# Patient Record
Sex: Female | Born: 1970 | Race: White | Hispanic: No | Smoking: Never smoker
Health system: Southern US, Community
[De-identification: ages and names within clinical notes are randomized; demographics above are authoritative.]

## PROBLEM LIST (undated history)

## (undated) DIAGNOSIS — R112 Nausea with vomiting, unspecified: Secondary | ICD-10-CM

## (undated) DIAGNOSIS — T7840XA Allergy, unspecified, initial encounter: Secondary | ICD-10-CM

## (undated) DIAGNOSIS — Z9889 Other specified postprocedural states: Secondary | ICD-10-CM

## (undated) DIAGNOSIS — J302 Other seasonal allergic rhinitis: Secondary | ICD-10-CM

## (undated) HISTORY — DX: Nausea with vomiting, unspecified: R11.2

## (undated) HISTORY — DX: Allergy, unspecified, initial encounter: T78.40XA

## (undated) HISTORY — PX: RHINOPLASTY: SUR1284

## (undated) HISTORY — PX: APPENDECTOMY: SHX54

## (undated) HISTORY — PX: WISDOM TOOTH EXTRACTION: SHX21

## (undated) HISTORY — PX: OTHER SURGICAL HISTORY: SHX169

## (undated) HISTORY — DX: Other specified postprocedural states: Z98.890

## (undated) HISTORY — PX: EYE SURGERY: SHX253

---

## 1998-09-10 ENCOUNTER — Other Ambulatory Visit: Admission: RE | Admit: 1998-09-10 | Discharge: 1998-09-10 | Payer: Self-pay | Admitting: Obstetrics and Gynecology

## 1999-08-30 ENCOUNTER — Other Ambulatory Visit: Admission: RE | Admit: 1999-08-30 | Discharge: 1999-08-30 | Payer: Self-pay | Admitting: Obstetrics and Gynecology

## 1999-09-30 ENCOUNTER — Inpatient Hospital Stay (HOSPITAL_COMMUNITY): Admission: AD | Admit: 1999-09-30 | Discharge: 1999-09-30 | Payer: Self-pay | Admitting: Obstetrics and Gynecology

## 1999-09-30 ENCOUNTER — Encounter: Payer: Self-pay | Admitting: Obstetrics and Gynecology

## 1999-12-16 ENCOUNTER — Inpatient Hospital Stay (HOSPITAL_COMMUNITY): Admission: AD | Admit: 1999-12-16 | Discharge: 1999-12-16 | Payer: Self-pay | Admitting: Obstetrics and Gynecology

## 1999-12-16 ENCOUNTER — Encounter: Payer: Self-pay | Admitting: *Deleted

## 2000-02-04 ENCOUNTER — Other Ambulatory Visit: Admission: RE | Admit: 2000-02-04 | Discharge: 2000-02-04 | Payer: Self-pay | Admitting: Obstetrics and Gynecology

## 2001-02-05 ENCOUNTER — Other Ambulatory Visit: Admission: RE | Admit: 2001-02-05 | Discharge: 2001-02-05 | Payer: Self-pay | Admitting: Obstetrics and Gynecology

## 2003-04-22 ENCOUNTER — Emergency Department (HOSPITAL_COMMUNITY): Admission: EM | Admit: 2003-04-22 | Discharge: 2003-04-23 | Payer: Self-pay | Admitting: Emergency Medicine

## 2004-04-07 ENCOUNTER — Other Ambulatory Visit: Admission: RE | Admit: 2004-04-07 | Discharge: 2004-04-07 | Payer: Self-pay | Admitting: Obstetrics and Gynecology

## 2011-04-09 ENCOUNTER — Inpatient Hospital Stay (INDEPENDENT_AMBULATORY_CARE_PROVIDER_SITE_OTHER)
Admission: RE | Admit: 2011-04-09 | Discharge: 2011-04-09 | Disposition: A | Discharge status: Home / Self Care | Payer: 59 | Source: Ambulatory Visit | Attending: Emergency Medicine | Admitting: Emergency Medicine

## 2011-04-09 ENCOUNTER — Ambulatory Visit (INDEPENDENT_AMBULATORY_CARE_PROVIDER_SITE_OTHER): Payer: 59

## 2011-04-09 DIAGNOSIS — S9030XA Contusion of unspecified foot, initial encounter: Secondary | ICD-10-CM

## 2012-09-07 ENCOUNTER — Encounter (HOSPITAL_COMMUNITY): Payer: Self-pay | Admitting: Emergency Medicine

## 2012-09-07 ENCOUNTER — Emergency Department (HOSPITAL_COMMUNITY)
Admission: EM | Admit: 2012-09-07 | Discharge: 2012-09-08 | Disposition: A | Discharge status: Home / Self Care | Payer: BC Managed Care – PPO | Attending: Emergency Medicine | Admitting: Emergency Medicine

## 2012-09-07 DIAGNOSIS — B9689 Other specified bacterial agents as the cause of diseases classified elsewhere: Secondary | ICD-10-CM

## 2012-09-07 DIAGNOSIS — N920 Excessive and frequent menstruation with regular cycle: Secondary | ICD-10-CM

## 2012-09-07 DIAGNOSIS — N898 Other specified noninflammatory disorders of vagina: Secondary | ICD-10-CM | POA: Insufficient documentation

## 2012-09-07 HISTORY — DX: Other seasonal allergic rhinitis: J30.2

## 2012-09-07 LAB — POCT PREGNANCY, URINE: Preg Test, Ur: NEGATIVE

## 2012-09-07 LAB — CBC
HCT: 39.6 % (ref 36.0–46.0)
Hemoglobin: 13.7 g/dL (ref 12.0–15.0)
MCH: 31.4 pg (ref 26.0–34.0)
MCHC: 34.6 g/dL (ref 30.0–36.0)
MCV: 90.6 fL (ref 78.0–100.0)
Platelets: 258 10*3/uL (ref 150–400)
RBC: 4.37 MIL/uL (ref 3.87–5.11)
RDW: 12.2 % (ref 11.5–15.5)
WBC: 7.9 10*3/uL (ref 4.0–10.5)

## 2012-09-07 NOTE — ED Notes (Signed)
C/o vaginal bleeding since Wednesday.  Seen at OB-GYN on Wednesday for same and had normal [place] and non-preg test.  Bleeding was initially bright red and then small amount of dark brown bleeding.  Small amount of bleeding since Wednesday.  States she is now having bright red blood with clots "that looks like liver" x 1 hour.  Denies pain.

## 2012-09-07 NOTE — ED Notes (Signed)
Pt has IUD.  LMP 9/16

## 2012-09-08 LAB — POCT I-STAT, CHEM 8
BUN: 9 mg/dL (ref 6–23)
Calcium, Ion: 1.21 mmol/L (ref 1.12–1.23)
Creatinine, Ser: 0.9 mg/dL (ref 0.50–1.10)
Glucose, Bld: 103 mg/dL — ABNORMAL HIGH (ref 70–99)
Hemoglobin: 13.6 g/dL (ref 12.0–15.0)
TCO2: 25 mmol/L (ref 0–100)

## 2012-09-08 LAB — WET PREP, GENITAL
Trich, Wet Prep: NONE SEEN
WBC, Wet Prep HPF POC: NONE SEEN

## 2012-09-08 MED ORDER — METRONIDAZOLE 500 MG PO TABS: 500.0000 mg | ORAL_TABLET | Freq: Two times a day (BID) | ORAL | Status: DC

## 2012-09-08 NOTE — ED Provider Notes (Signed)
History     CSN: [id number]  Arrival date & time 09/07/12  1820   First MD Initiated Contact with Patient 09/07/12 2352      Chief Complaint  Patient presents with  . Vaginal Bleeding    (Consider location/radiation/quality/duration/timing/severity/associated sxs/prior treatment) Patient is a 41 y.o. female presenting with vaginal bleeding. The history is provided by the patient.  Vaginal Bleeding This is a new problem. The current episode started more than 2 days ago. The problem occurs constantly. The problem has been gradually worsening. Pertinent negatives include no chest pain, no abdominal pain, no headaches and no shortness of breath. Nothing aggravates the symptoms. Nothing relieves the symptoms. She has tried nothing for the symptoms. The treatment provided no relief.    Past Medical History  Diagnosis Date  . Seasonal allergies     Past Surgical History  Procedure Date  . Appendectomy   . Cesarean section     No family history on file.  History  Substance Use Topics  . Smoking status: Never Smoker   . Smokeless tobacco: Not on file  . Alcohol Use: Yes    OB History    Grav Para Term Preterm Abortions TAB SAB Ect Mult Living                  Review of Systems  Respiratory: Negative for chest tightness and shortness of breath.   Cardiovascular: Negative for chest pain.  Gastrointestinal: Negative for abdominal pain.  Genitourinary: Positive for vaginal bleeding. Negative for dysuria.  Neurological: Negative for headaches.  All other systems reviewed and are negative.    Allergies  Penicillins  Home Medications   Current Outpatient Rx  Name Route Sig Dispense Refill  . CETIRIZINE HCL 10 MG PO TABS Oral Take 10 mg by mouth daily.      BP 132/90  Pulse 58  Temp 98.1 F (36.7 C) (Oral)  Resp 18  SpO2 98%  LMP 08/27/2012  Physical Exam  Constitutional: She is oriented to person, place, and time. She appears well-developed and  well-nourished. No distress.  HENT:  Head: Normocephalic and atraumatic.  Mouth/Throat: Oropharynx is clear and moist.  Eyes: Conjunctivae normal are normal. Pupils are equal, round, and reactive to light.  Neck: Normal range of motion. Neck supple.  Cardiovascular: Normal rate and regular rhythm.   Pulmonary/Chest: Effort normal.  Abdominal: Soft. Bowel sounds are normal. There is no tenderness. There is no rebound and no guarding.  Genitourinary: No vaginal discharge found.       Scant bleeding  Musculoskeletal: Normal range of motion.  Neurological: She is alert and oriented to person, place, and time.  Skin: Skin is warm and dry.  Psychiatric: She has a normal mood and affect.    ED Course  Procedures (including critical care time)  Labs Reviewed  WET PREP, GENITAL - Abnormal; Notable for the following:    Clue Cells Wet Prep HPF POC FEW (*)     All other components within normal limits  POCT I-STAT, CHEM 8 - Abnormal; Notable for the following:    Glucose, Bld 103 (*)     All other components within normal limits  CBC  POCT PREGNANCY, URINE  GC/CHLAMYDIA PROBE AMP, GENITAL   No results found.   No diagnosis found.    MDM  Follow up with GYN on Monday.  Had normal [place] this week.  No indication for repeat at this time.  Return for worsening sx.  Jasmine Awe, MD 09/08/12 [phone]

## 2012-09-08 NOTE — ED Notes (Signed)
Pt denies any complaints at this time

## 2013-12-12 DIAGNOSIS — F32A Depression, unspecified: Secondary | ICD-10-CM

## 2013-12-12 DIAGNOSIS — F329 Major depressive disorder, single episode, unspecified: Secondary | ICD-10-CM

## 2013-12-12 HISTORY — DX: Depression, unspecified: F32.A

## 2014-03-07 ENCOUNTER — Emergency Department (HOSPITAL_COMMUNITY): Payer: 59

## 2014-03-07 ENCOUNTER — Emergency Department (HOSPITAL_COMMUNITY)
Admission: EM | Admit: 2014-03-07 | Discharge: 2014-03-07 | Disposition: A | Discharge status: Home / Self Care | Payer: 59 | Attending: Emergency Medicine | Admitting: Emergency Medicine

## 2014-03-07 ENCOUNTER — Encounter (HOSPITAL_COMMUNITY): Payer: Self-pay | Admitting: Emergency Medicine

## 2014-03-07 DIAGNOSIS — S161XXA Strain of muscle, fascia and tendon at neck level, initial encounter: Secondary | ICD-10-CM

## 2014-03-07 DIAGNOSIS — Z79899 Other long term (current) drug therapy: Secondary | ICD-10-CM | POA: Insufficient documentation

## 2014-03-07 DIAGNOSIS — M5020 Other cervical disc displacement, unspecified cervical region: Secondary | ICD-10-CM

## 2014-03-07 DIAGNOSIS — S139XXA Sprain of joints and ligaments of unspecified parts of neck, initial encounter: Secondary | ICD-10-CM | POA: Insufficient documentation

## 2014-03-07 DIAGNOSIS — Z880 Allergy status to penicillin: Secondary | ICD-10-CM | POA: Insufficient documentation

## 2014-03-07 DIAGNOSIS — M5030 Other cervical disc degeneration, unspecified cervical region: Secondary | ICD-10-CM | POA: Insufficient documentation

## 2014-03-07 DIAGNOSIS — Y9389 Activity, other specified: Secondary | ICD-10-CM | POA: Insufficient documentation

## 2014-03-07 DIAGNOSIS — Y92410 Unspecified street and highway as the place of occurrence of the external cause: Secondary | ICD-10-CM | POA: Insufficient documentation

## 2014-03-07 MED ORDER — HYDROCODONE-ACETAMINOPHEN 5-325 MG PO TABS
1.0000 | ORAL_TABLET | Freq: Once | ORAL | Status: AC
  Administered 2014-03-07: 1 via ORAL
  Filled 2014-03-07: qty 1

## 2014-03-07 MED ORDER — LORAZEPAM 1 MG PO TABS
1.0000 mg | ORAL_TABLET | Freq: Once | ORAL | Status: AC
  Administered 2014-03-07: 1 mg via ORAL
  Filled 2014-03-07: qty 1

## 2014-03-07 NOTE — ED Notes (Signed)
Pt returned from MRI °
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
## 2014-03-07 NOTE — ED Notes (Signed)
Patient transported to MRI 

## 2014-03-07 NOTE — ED Notes (Signed)
Soft cervical collar applied.
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
## 2014-03-07 NOTE — ED Notes (Signed)
PA Dammen at bedside. 
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
## 2014-03-08 NOTE — ED Provider Notes (Signed)
Medical screening examination/treatment/procedure(s) were performed by non-physician practitioner and as supervising physician I was immediately available for consultation/collaboration.    Dot Lanes, MD 03/08/14 [phone]

## 2016-12-07 ENCOUNTER — Encounter: Payer: Self-pay | Admitting: Podiatry

## 2016-12-07 ENCOUNTER — Ambulatory Visit (INDEPENDENT_AMBULATORY_CARE_PROVIDER_SITE_OTHER): Payer: PRIVATE HEALTH INSURANCE

## 2016-12-07 ENCOUNTER — Ambulatory Visit (INDEPENDENT_AMBULATORY_CARE_PROVIDER_SITE_OTHER): Payer: PRIVATE HEALTH INSURANCE | Admitting: Podiatry

## 2016-12-07 VITALS — BP 121/84 | HR 74 | Resp 16 | Ht 66.0 in | Wt 230.0 lb

## 2016-12-07 DIAGNOSIS — M79671 Pain in right foot: Secondary | ICD-10-CM | POA: Diagnosis not present

## 2016-12-07 DIAGNOSIS — M79675 Pain in left toe(s): Secondary | ICD-10-CM

## 2016-12-07 DIAGNOSIS — M779 Enthesopathy, unspecified: Secondary | ICD-10-CM

## 2016-12-07 MED ORDER — TRIAMCINOLONE ACETONIDE 10 MG/ML IJ SUSP
10.0000 mg | Freq: Once | INTRAMUSCULAR | Status: AC
  Administered 2016-12-07: 10 mg
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
## 2016-12-08 NOTE — Progress Notes (Signed)
Subjective:     Patient ID: Morgan Norris, female   DOB: 01-18-1971, 45 y.o.   MRN: [id number]  HPI patient presents stating that she had a small knot on the bottom of her left foot that she took out a small coronal in the last couple days and the top of her right foot has been chronically sore since injury experience several years ago   Review of Systems  All other systems reviewed and are negative.      Objective:   Physical Exam  Constitutional: She is oriented to person, place, and time.  Cardiovascular: Intact distal pulses.   Musculoskeletal: Normal range of motion.  Neurological: She is oriented to person, place, and time.  Skin: Skin is warm.  Nursing note and vitals reviewed.  neurovascular status intact muscle strength adequate range of motion within normal limits with patient found to have a small irritation left plantar fifth metatarsal that's localized with no drainage or proximal edema. On the right foot there is discomfort in the midtarsal joint localized in nature with no indications of acute edema or other inflammatory condition. 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

## 2019-05-20 ENCOUNTER — Other Ambulatory Visit: Payer: Self-pay | Admitting: Obstetrics and Gynecology

## 2019-05-20 DIAGNOSIS — N6320 Unspecified lump in the left breast, unspecified quadrant: Secondary | ICD-10-CM

## 2019-05-28 ENCOUNTER — Other Ambulatory Visit: Payer: Self-pay

## 2019-05-28 ENCOUNTER — Ambulatory Visit
Admission: RE | Admit: 2019-05-28 | Discharge: 2019-05-28 | Disposition: A | Discharge status: Home / Self Care | Payer: Commercial Managed Care - PPO | Source: Ambulatory Visit | Attending: Obstetrics and Gynecology | Admitting: Obstetrics and Gynecology

## 2019-05-28 DIAGNOSIS — N6320 Unspecified lump in the left breast, unspecified quadrant: Secondary | ICD-10-CM

## 2020-02-08 IMAGING — MG DIGITAL DIAGNOSTIC UNILATERAL LEFT MAMMOGRAM WITH TOMO AND CAD
4 series · 4 of 12 positions shown · non-contrast
Comparison: Previous exam(s).

CLINICAL DATA: Recall from outside screening mammography with
tomosynthesis, possible mass involving the INNER LEFT breast at
MIDDLE depth.

EXAM:
DIGITAL DIAGNOSTIC LEFT MAMMOGRAM WITH TOMO
ULTRASOUND LEFT BREAST

[L CC synth-2D]
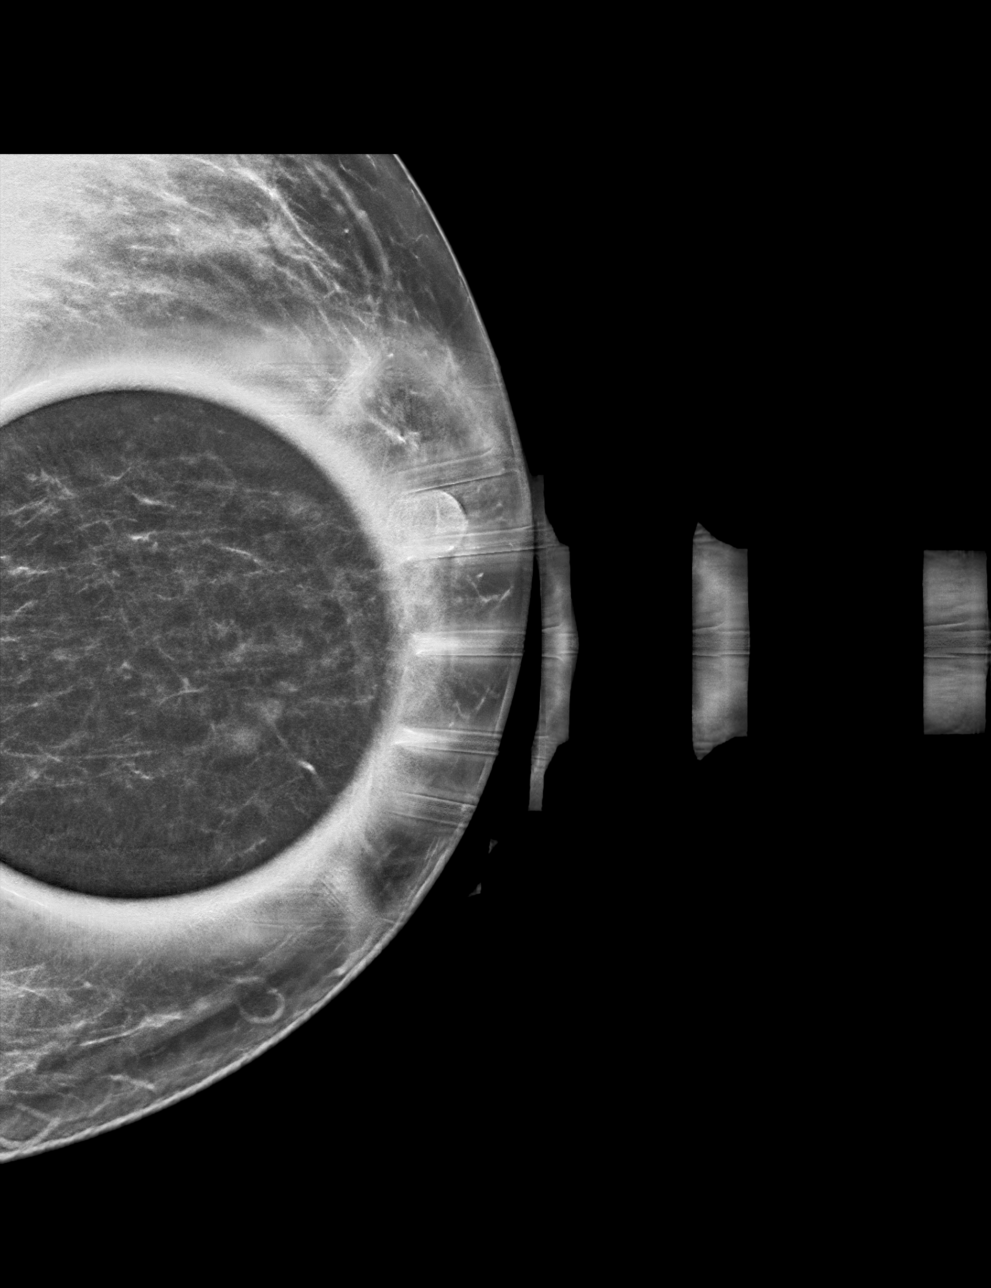

[L MLO synth-2D]
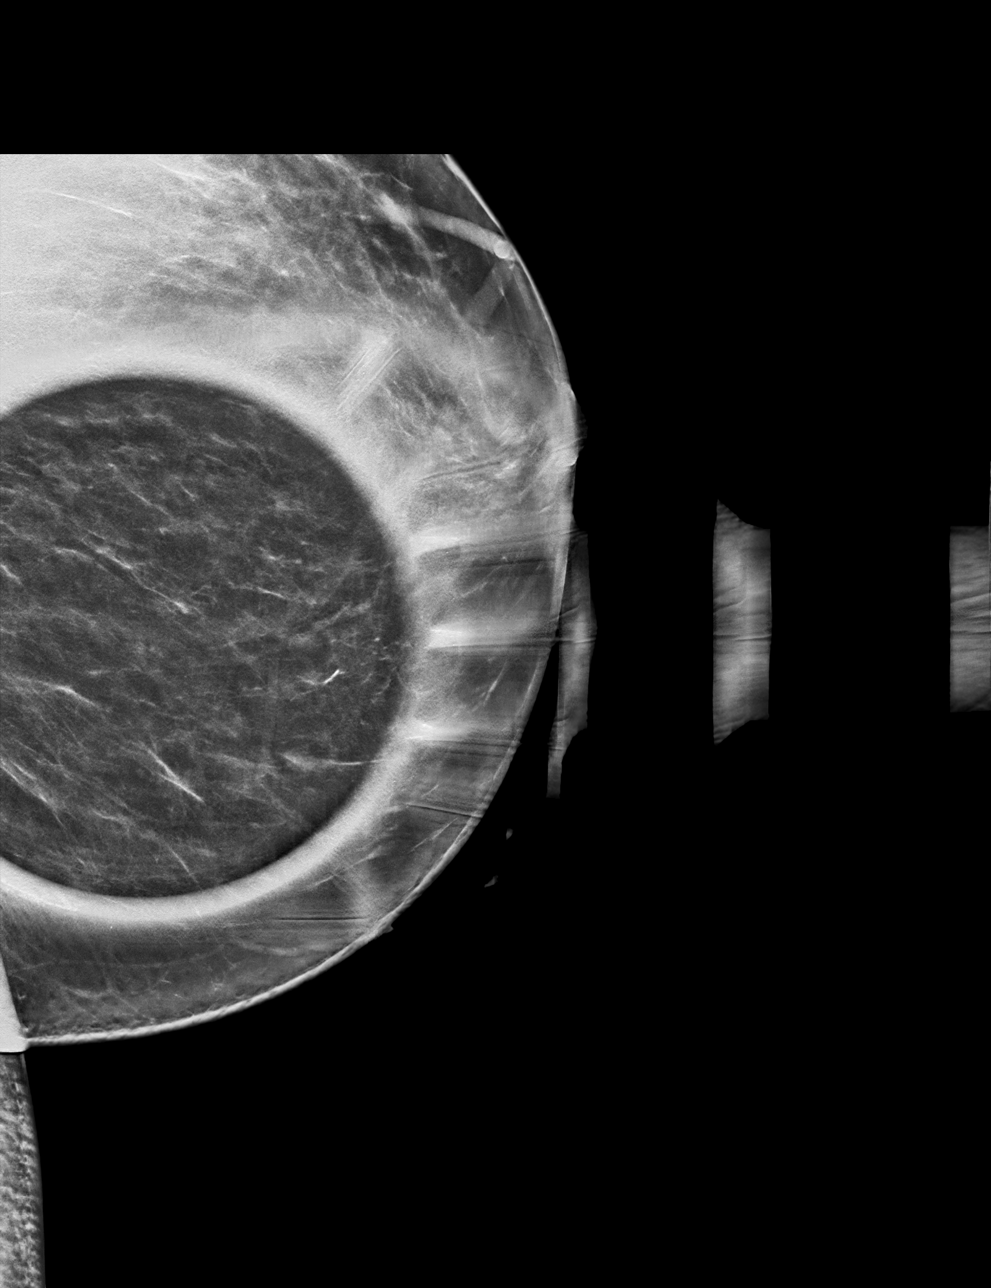

[L MLO tomo · tomo slice 38/75.0]
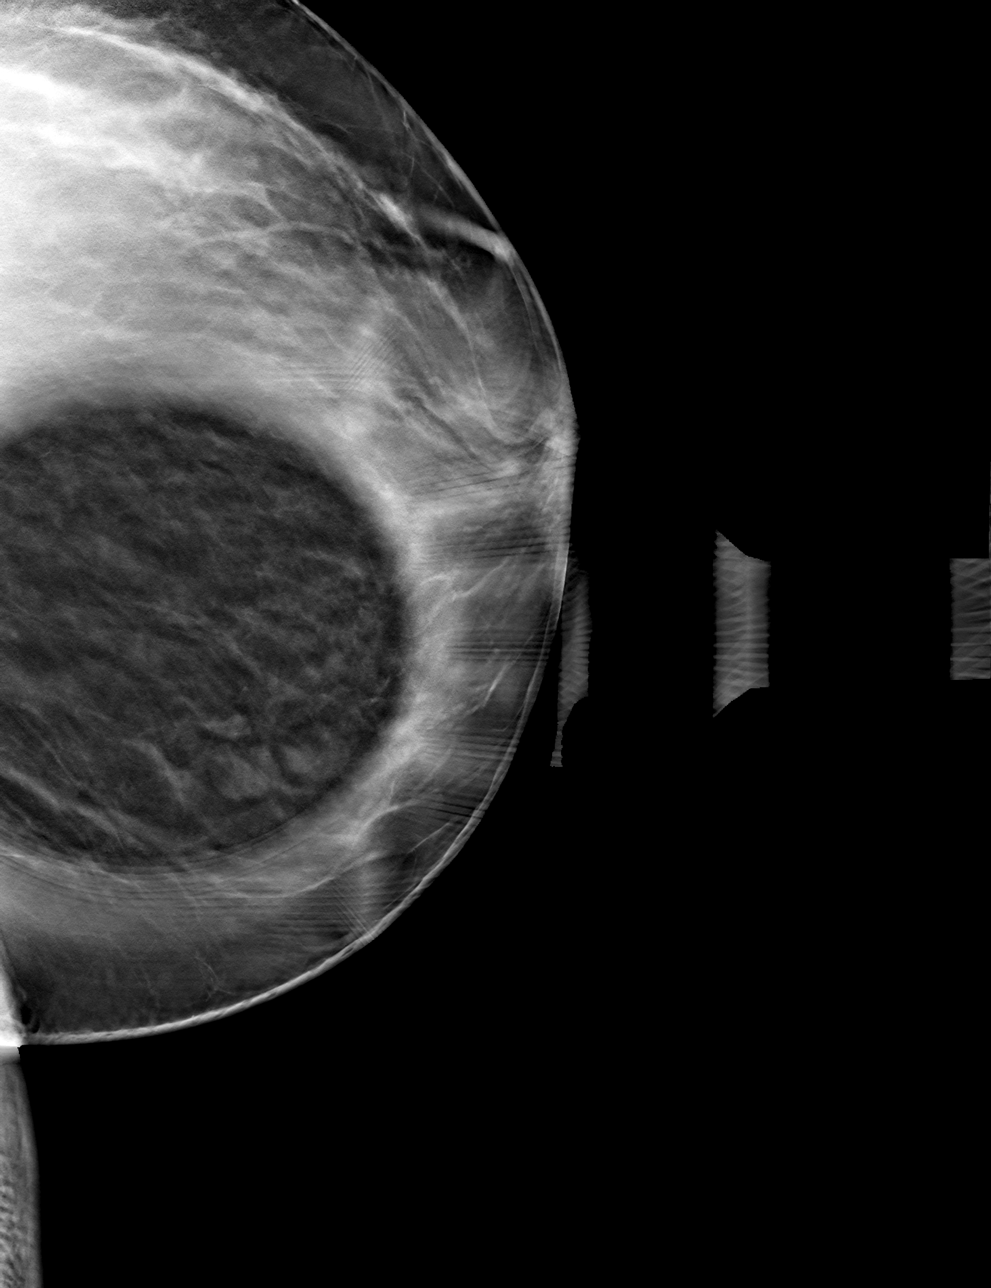

[L CC tomo · tomo slice 31/62.0]
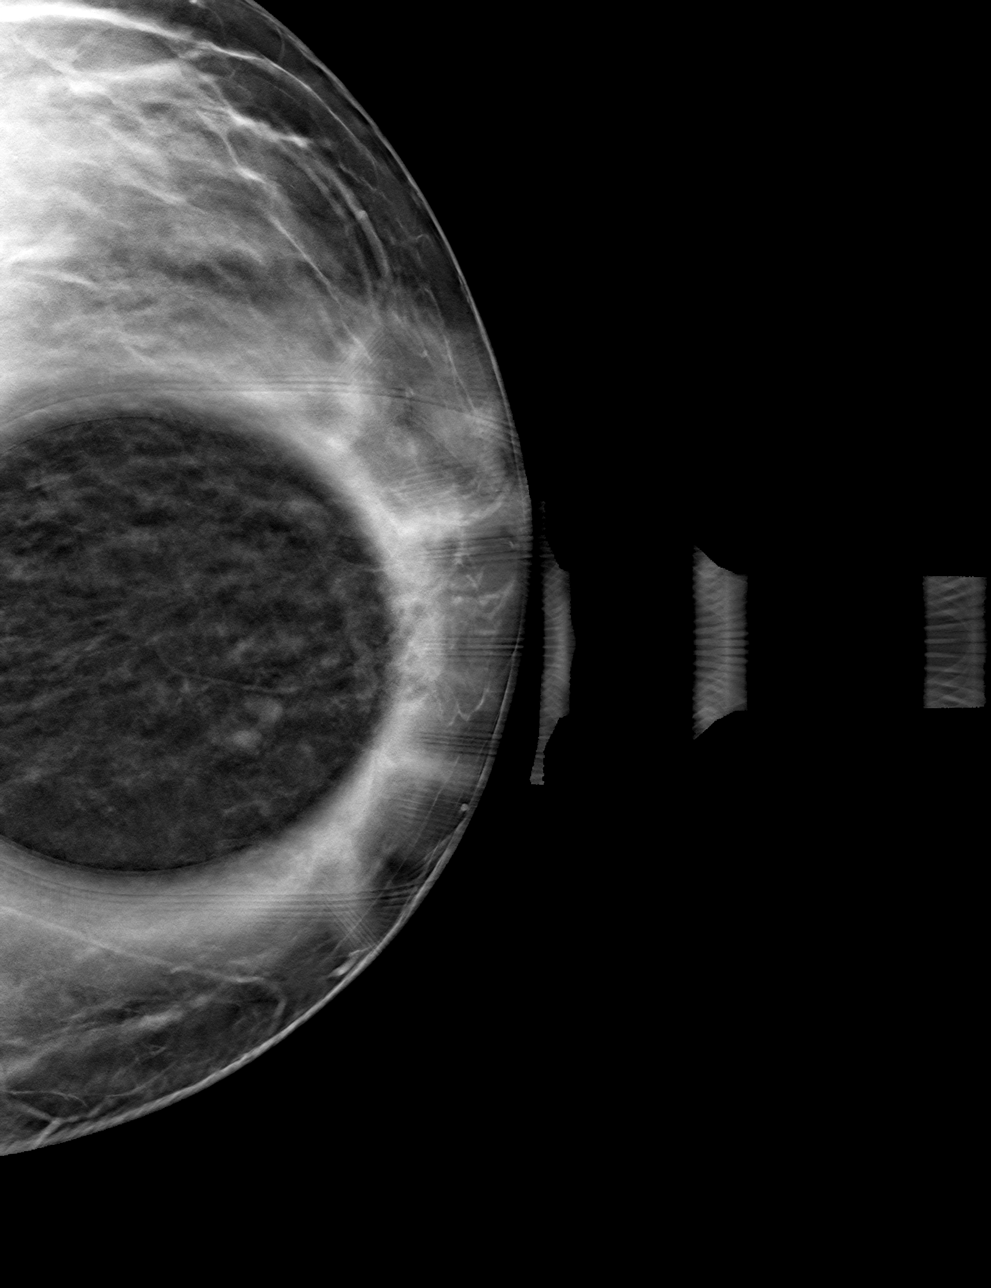

[4 of 12 positions shown; findings below may reference images not displayed]

ACR Breast Density Category b: There are scattered areas of
fibroglandular density.
FINDINGS: Tomosynthesis and synthesized spot-compression CC and MLO views of
the area of concern in the LEFT breast were obtained.

Spot compression images confirm a circumscribed low-density mass
measuring on the order of 1.3 cm in maximum size without associated
architectural distortion or suspicious calcifications. There may be
central fat within the mass, best seen on the spot compression CC
images.

Targeted LEFT breast ultrasound is performed, showing a benign
simple cyst at the 8 o'clock position approximately 3 cm from the
nipple at MIDDLE depth measuring approximately 0.4 x 0.7 x 1.2 cm,
demonstrating posterior acoustic enhancement and no internal power
Doppler flow, corresponding to the screening mammographic finding.
No suspicious solid mass or abnormal acoustic shadowing is
identified.
IMPRESSION: 1. No mammographic or sonographic evidence of malignancy involving
the LEFT breast.
2. Benign approximate 1.2 cm cyst involving the LOWER INNER QUADRANT
of the LEFT breast which accounts for the screening mammographic
finding.

RECOMMENDATION:
Screening mammogram in one year.(Code:DA-[id number])

I have discussed the findings and recommendations with the patient.
Results were also provided in writing at the conclusion of the
visit. If applicable, a reminder letter will be sent to the patient
regarding the next appointment.

BI-RADS CATEGORY  2: Benign.

## 2020-07-02 ENCOUNTER — Other Ambulatory Visit: Payer: Self-pay

## 2020-07-03 ENCOUNTER — Encounter: Payer: Self-pay | Admitting: Family Medicine

## 2020-07-03 ENCOUNTER — Ambulatory Visit (INDEPENDENT_AMBULATORY_CARE_PROVIDER_SITE_OTHER): Payer: Commercial Managed Care - PPO | Admitting: Family Medicine

## 2020-07-03 VITALS — BP 126/78 | HR 82 | Temp 98.0°F | Ht 66.0 in | Wt 256.6 lb

## 2020-07-03 DIAGNOSIS — Q6671 Congenital pes cavus, right foot: Secondary | ICD-10-CM | POA: Diagnosis not present

## 2020-07-03 DIAGNOSIS — Q6672 Congenital pes cavus, left foot: Secondary | ICD-10-CM

## 2020-07-03 DIAGNOSIS — Z23 Encounter for immunization: Secondary | ICD-10-CM | POA: Diagnosis not present

## 2020-07-03 DIAGNOSIS — E559 Vitamin D deficiency, unspecified: Secondary | ICD-10-CM | POA: Diagnosis not present

## 2020-07-03 DIAGNOSIS — Z0000 Encounter for general adult medical examination without abnormal findings: Secondary | ICD-10-CM | POA: Diagnosis not present

## 2020-07-03 DIAGNOSIS — Z6841 Body Mass Index (BMI) 40.0 and over, adult: Secondary | ICD-10-CM | POA: Insufficient documentation

## 2020-07-03 LAB — VITAMIN D 25 HYDROXY (VIT D DEFICIENCY, FRACTURES): VITD: 20.25 ng/mL — ABNORMAL LOW (ref 30.00–100.00)

## 2020-07-03 LAB — CBC
HCT: 36.9 % (ref 36.0–46.0)
Hemoglobin: 12.2 g/dL (ref 12.0–15.0)
MCHC: 33.1 g/dL (ref 30.0–36.0)
MCV: 85 fl (ref 78.0–100.0)
Platelets: 338 10*3/uL (ref 150.0–400.0)
RBC: 4.34 Mil/uL (ref 3.87–5.11)
RDW: 14.1 % (ref 11.5–15.5)
WBC: 8.9 10*3/uL (ref 4.0–10.5)

## 2020-07-03 LAB — COMPREHENSIVE METABOLIC PANEL
ALT: 10 U/L (ref 0–35)
AST: 11 U/L (ref 0–37)
Albumin: 3.7 g/dL (ref 3.5–5.2)
Alkaline Phosphatase: 79 U/L (ref 39–117)
BUN: 11 mg/dL (ref 6–23)
CO2: 25 mEq/L (ref 19–32)
Calcium: 8.6 mg/dL (ref 8.4–10.5)
Chloride: 103 mEq/L (ref 96–112)
Creatinine, Ser: 0.8 mg/dL (ref 0.40–1.20)
GFR: 76.85 mL/min (ref >60.00)
Glucose, Bld: 112 mg/dL — ABNORMAL HIGH (ref 70–99)
Potassium: 4.1 mEq/L (ref 3.5–5.1)
Sodium: 135 mEq/L (ref 135–145)
Total Bilirubin: 0.4 mg/dL (ref 0.2–1.2)
Total Protein: 6.4 g/dL (ref 6.0–8.3)

## 2020-07-03 LAB — URINALYSIS, ROUTINE W REFLEX MICROSCOPIC
Bilirubin Urine: NEGATIVE
Hgb urine dipstick: NEGATIVE
Ketones, ur: NEGATIVE
Leukocytes,Ua: NEGATIVE
Nitrite: NEGATIVE
Specific Gravity, Urine: 1.02 (ref 1.000–1.030)
Total Protein, Urine: NEGATIVE
Urine Glucose: NEGATIVE
Urobilinogen, UA: 0.2 (ref 0.0–1.0)
pH: 6.5 (ref 5.0–8.0)

## 2020-07-03 LAB — LIPID PANEL
Cholesterol: 161 mg/dL (ref 0–200)
HDL: 44.6 mg/dL (ref >39.00)
LDL Cholesterol: 90 mg/dL (ref 0–99)
NonHDL: 115.93
Total CHOL/HDL Ratio: 4
Triglycerides: 131 mg/dL (ref 0.0–149.0)
VLDL: 26.2 mg/dL (ref 0.0–40.0)

## 2020-07-03 LAB — HEMOGLOBIN A1C: Hgb A1c MFr Bld: 6.2 % (ref 4.6–6.5)

## 2020-07-03 LAB — TSH: TSH: 1.82 u[IU]/mL (ref 0.35–4.50)

## 2020-07-03 NOTE — Progress Notes (Signed)
New Patient Office Visit  Subjective:  Patient ID: Morgan Norris, female    DOB: 1971/05/16  Age: 49 y.o. MRN: [id number]  CC:  Chief Complaint  Patient presents with  . Establish Care    New patient, would like BP evaluated.     HPI Morgan Norris presents for a physical examination.  She had tried to donate blood a few weeks ago when her blood pressure at the [facility] was 160/95.  She has no history of hypertension.  She does not smoke or use illicit drugs.  Today she tells me that she drinks on occasion which is maybe 1 or 2 glasses of wine every few weeks.  Her father has hypertension high cholesterol and CAD.  Mom has hypothyroidism.  Patient works from home for a [employer] and does admit to dietary grazing throughout the day.  She is not exercising.  Regular GYN and dental care.  She has a Corporate treasurer.  History of Covid and has declined vaccination.  Past Medical History:  Diagnosis Date  . Allergy   . Depression 2015  . Seasonal allergies     Past Surgical History:  Procedure Laterality Date  . APPENDECTOMY    . CESAREAN SECTION    . chin inplant    . EYE SURGERY      Family History  Problem Relation Age of Onset  . Hypothyroidism Mother   . Hypertension Father   . Hyperlipidemia Father     Social History   Socioeconomic History  . Marital status: Divorced    Spouse name: Not on file  . Number of children: Not on file  . Years of education: Not on file  . Highest education level: Not on file  Occupational History  . Not on file  Tobacco Use  . Smoking status: Never Smoker  . Smokeless tobacco: Never Used  Vaping Use  . Vaping Use: Never used  Substance and Sexual Activity  . Alcohol use: Yes    Alcohol/week: 2.0 standard drinks    Types: 1 Glasses of wine, 1 Standard drinks or equivalent per week    Comment: occ  . Drug use: No  . Sexual activity: Not on file  Other Topics Concern  . Not on file  Social History Narrative  . Not on file    Social Determinants of Health   Financial Resource Strain:   . Difficulty of Paying Living Expenses:   Food Insecurity:   . Worried About Charity fundraiser in the Last Year:   . Arboriculturist in the Last Year:   Transportation Needs:   . Film/video editor (Medical):   Marland Kitchen Lack of Transportation (Non-Medical):   Physical Activity:   . Days of Exercise per Week:   . Minutes of Exercise per Session:   Stress:   . Feeling of Stress :   Social Connections:   . Frequency of Communication with Friends and Family:   . Frequency of Social Gatherings with Friends and Family:   . Attends Religious Services:   . Active Member of Clubs or Organizations:   . Attends Archivist Meetings:   Marland Kitchen Marital Status:   Intimate Partner Violence:   . Fear of Current or Ex-Partner:   . Emotionally Abused:   Marland Kitchen Physically Abused:   . Sexually Abused:     ROS Review of Systems  Constitutional: Negative.   HENT: Negative.   Eyes: Negative for photophobia and visual disturbance.  Respiratory: Negative.   Cardiovascular: Negative.   Gastrointestinal: Negative.   Endocrine: Negative for polyphagia and polyuria.  Genitourinary: Negative.   Musculoskeletal: Negative for gait problem and joint swelling.  Skin: Negative for pallor and rash.  Allergic/Immunologic: Negative for immunocompromised state.  Neurological: Negative for light-headedness and numbness.  Hematological: Does not bruise/bleed easily.  Psychiatric/Behavioral: Negative.      Depression screen [facility] 2/9 07/03/2020  Decreased Interest 0  Down, Depressed, Hopeless 0  PHQ - 2 Score 0     Objective:   Today's Vitals: BP 126/78   Pulse 82   Temp 98 F (36.7 C) (Tympanic)   Ht 5\' 6"  (1.676 m)   Wt (!) 256 lb 9.6 oz (116.4 kg)   SpO2 97%   BMI 41.42 kg/m   Physical Exam Vitals and nursing note reviewed.  Constitutional:      General: She is not in acute distress.    Appearance: Normal appearance. She is  obese. She is not ill-appearing, toxic-appearing or diaphoretic.  HENT:     Head: Normocephalic and atraumatic.     Right Ear: Tympanic membrane, ear canal and external ear normal.     Left Ear: Tympanic membrane, ear canal and external ear normal.     Nose: No congestion or rhinorrhea.     Mouth/Throat:     Mouth: Mucous membranes are moist.     Pharynx: Oropharynx is clear. No oropharyngeal exudate or posterior oropharyngeal erythema.  Eyes:     General: No scleral icterus.       Right eye: No discharge.        Left eye: No discharge.     Extraocular Movements: Extraocular movements intact.     Conjunctiva/sclera: Conjunctivae normal.     Pupils: Pupils are equal, round, and reactive to light.  Cardiovascular:     Rate and Rhythm: Normal rate and regular rhythm.     Pulses:          Dorsalis pedis pulses are 2+ on the right side and 2+ on the left side.       Posterior tibial pulses are 1+ on the right side and 1+ on the left side.  Pulmonary:     Effort: Pulmonary effort is normal.     Breath sounds: Normal breath sounds.  Abdominal:     General: Bowel sounds are normal.  Musculoskeletal:     Cervical back: Normal range of motion and neck supple. No rigidity.     Right lower leg: No edema.     Left lower leg: No edema.       Feet:  Lymphadenopathy:     Cervical: No cervical adenopathy.  Skin:    General: Skin is warm and dry.  Neurological:     Mental Status: She is alert and oriented to person, place, and time.  Psychiatric:        Mood and Affect: Mood normal.        Behavior: Behavior normal.     Assessment & Plan:   Problem List Items Addressed This Visit      Other   Class 3 severe obesity due to excess calories with body mass index (BMI) of 40.0 to 44.9 in adult [facility])   Healthcare maintenance - Primary   Relevant Orders   CBC   Comprehensive metabolic panel   Hemoglobin A1c   Lipid panel   TSH   Urinalysis, Routine w reflex microscopic   Vitamin D  deficiency   Relevant Orders  VITAMIN D 25 Hydroxy (Vit-D Deficiency, Fractures)   Congenital cavus deformity of both feet      Outpatient Encounter Medications as of 07/03/2020  Medication Sig  . cetirizine (ZYRTEC) 10 MG tablet Take 10 mg by mouth daily.  . cholecalciferol (VITAMIN D3) 25 MCG (1000 UNIT) tablet Take 1,000 Units by mouth daily.   No facility-administered encounter medications on file as of 07/03/2020.    Follow-up: Return in about 3 months (around 10/03/2020).   Information was given on health maintenance and disease prevention.  Also given information on BMI, obesity and calorie counting to lose weight.  Information was given about cavus feet.  Advised her to wear foot wear that accommodate her elevated arch.  Information was given on preventing hypertension.  She will check and record her blood pressure ribs over the next 3 months and follow-up.  Patient has a history of Covid we discussed that there is better immunity with also the vaccine.  She thanked me for my opinion and declined.  Recommended regular exercise by walking for 30 minutes at least 5 days weekly.  Libby Maw, MD

## 2020-07-03 NOTE — Patient Instructions (Addendum)
BMI for Adults What is BMI? Body mass index (BMI) is a number that is calculated from a person's weight and height. BMI can help estimate how much of a person's weight is composed of fat. BMI does not measure body fat directly. Rather, it is an alternative to procedures that directly measure body fat, which can be difficult and expensive. BMI can help identify people who may be at higher risk for certain medical problems. What are BMI measurements used for? BMI is used as a screening tool to identify possible weight problems. It helps determine whether a person is obese, overweight, a healthy weight, or underweight. BMI is useful for:  Identifying a weight problem that may be related to a medical condition or may increase the risk for medical problems.  Promoting changes, such as changes in diet and exercise, to help reach a healthy weight. BMI screening can be repeated to see if these changes are working. How is BMI calculated? BMI involves measuring your weight in relation to your height. Both height and weight are measured, and the BMI is calculated from those numbers. This can be done either in [place] ([place] or metric measurements. Note that charts and online BMI calculators are available to help you find your BMI quickly and easily without having to do these calculations yourself. To calculate your BMI in [place] ([place] measurements:  1. Measure your weight in pounds (lb). 2. Multiply the number of pounds by 703. ? For example, for a person who weighs 180 lb, multiply that number by 703, which equals 126,540. 3. Measure your height in inches. Then multiply that number by itself to get a measurement called "inches squared." ? For example, for a person who is 70 inches tall, the "inches squared" measurement is 70 inches x 70 inches, which equals 4,900 inches squared. 4. Divide the total from step 2 (number of lb x 703) by the total from step 3 (inches squared): 126,540  4,900 = 25.8. This is  your BMI. To calculate your BMI in metric measurements: 1. Measure your weight in kilograms (kg). 2. Measure your height in meters (m). Then multiply that number by itself to get a measurement called "meters squared." ? For example, for a person who is 1.75 m tall, the "meters squared" measurement is 1.75 m x 1.75 m, which is equal to 3.1 meters squared. 3. Divide the number of kilograms (your weight) by the meters squared number. In this example: 70  3.1 = 22.6. This is your BMI. What do the results mean? BMI charts are used to identify whether you are underweight, normal weight, overweight, or obese. The following guidelines will be used:  Underweight: BMI less than 18.5.  Normal weight: BMI between 18.5 and 24.9.  Overweight: BMI between 25 and 29.9.  Obese: BMI of 30 or above. Keep these notes in mind:  Weight includes both fat and muscle, so someone with a muscular build, such as an athlete, may have a BMI that is higher than 24.9. In cases like these, BMI is not an accurate measure of body fat.  To determine if excess body fat is the cause of a BMI of 25 or higher, further assessments may need to be done by a health care provider.  BMI is usually interpreted in the same way for men and women. Where to find more information For more information about BMI, including tools to quickly calculate your BMI, go to these websites:  Centers for Disease Control and Prevention: http://www.wolf.info/  American  Heart Association: www.heart.org  National Heart, Lung, and Blood Institute: https://wilson-eaton.com/ Summary  Body mass index (BMI) is a number that is calculated from a person's weight and height.  BMI may help estimate how much of a person's weight is composed of fat. BMI can help identify those who may be at higher risk for certain medical problems.  BMI can be measured using English measurements or metric measurements.  BMI charts are used to identify whether you are underweight, normal  weight, overweight, or obese. This information is not intended to replace advice given to you by your health care provider. Make sure you discuss any questions you have with your health care provider. Document Revised: 08/21/2019 Document Reviewed: 06/28/2019 Elsevier Patient Education  [place] Maintenance, Female Adopting a healthy lifestyle and getting preventive care are important in promoting health and wellness. Ask your health care provider about:  The right schedule for you to have regular tests and exams.  Things you can do on your own to prevent diseases and keep yourself healthy. What should I know about diet, weight, and exercise? Eat a healthy diet   Eat a diet that includes plenty of vegetables, fruits, low-fat dairy products, and lean protein.  Do not eat a lot of foods that are high in solid fats, added sugars, or sodium. Maintain a healthy weight Body mass index (BMI) is used to identify weight problems. It estimates body fat based on height and weight. Your health care provider can help determine your BMI and help you achieve or maintain a healthy weight. Get regular exercise Get regular exercise. This is one of the most important things you can do for your health. Most adults should:  Exercise for at least 150 minutes each week. The exercise should increase your heart rate and make you sweat (moderate-intensity exercise).  Do strengthening exercises at least twice a week. This is in addition to the moderate-intensity exercise.  Spend less time sitting. Even light physical activity can be beneficial. Watch cholesterol and blood lipids Have your blood tested for lipids and cholesterol at 47 years of age, then have this test every 5 years. Have your cholesterol levels checked more often if:  Your lipid or cholesterol levels are high.  You are older than 47 years of age.  You are at high risk for heart disease. What should I know about cancer  screening? Depending on your health history and family history, you may need to have cancer screening at various ages. This may include screening for:  Breast cancer.  Cervical cancer.  Colorectal cancer.  Skin cancer.  Lung cancer. What should I know about heart disease, diabetes, and high blood pressure? Blood pressure and heart disease  High blood pressure causes heart disease and increases the risk of stroke. This is more likely to develop in people who have high blood pressure readings, are of African descent, or are overweight.  Have your blood pressure checked: ? Every 3-5 years if you are 71-76 years of age. ? Every year if you are 45 years old or older. Diabetes Have regular diabetes screenings. This checks your fasting blood sugar level. Have the screening done:  Once every three years after age 45 if you are at a normal weight and have a low risk for diabetes.  More often and at a younger age if you are overweight or have a high risk for diabetes. What should I know about preventing infection? Hepatitis B If you have a higher risk  for hepatitis B, you should be screened for this virus. Talk with your health care provider to find out if you are at risk for hepatitis B infection. Hepatitis C Testing is recommended for:  Everyone born from [birth year] through 1963.  Anyone with known risk factors for hepatitis C. Sexually transmitted infections (STIs)  Get screened for STIs, including gonorrhea and chlamydia, if: ? You are sexually active and are younger than 47 years of age. ? You are older than 48 years of age and your health care provider tells you that you are at risk for this type of infection. ? Your sexual activity has changed since you were last screened, and you are at increased risk for chlamydia or gonorrhea. Ask your health care provider if you are at risk.  Ask your health care provider about whether you are at high risk for HIV. Your health care provider may  recommend a prescription medicine to help prevent HIV infection. If you choose to take medicine to prevent HIV, you should first get tested for HIV. You should then be tested every 3 months for as long as you are taking the medicine. Pregnancy  If you are about to stop having your period (premenopausal) and you may become pregnant, seek counseling before you get pregnant.  Take 400 to 800 micrograms (mcg) of folic acid every day if you become pregnant.  Ask for birth control (contraception) if you want to prevent pregnancy. Osteoporosis and menopause Osteoporosis is a disease in which the bones lose minerals and strength with aging. This can result in bone fractures. If you are 47 years old or older, or if you are at risk for osteoporosis and fractures, ask your health care provider if you should:  Be screened for bone loss.  Take a calcium or vitamin D supplement to lower your risk of fractures.  Be given hormone replacement therapy (HRT) to treat symptoms of menopause. Follow these instructions at home: Lifestyle  Do not use any products that contain nicotine or tobacco, such as cigarettes, e-cigarettes, and chewing tobacco. If you need help quitting, ask your health care provider.  Do not use street drugs.  Do not share needles.  Ask your health care provider for help if you need support or information about quitting drugs. Alcohol use  Do not drink alcohol if: ? Your health care provider tells you not to drink. ? You are pregnant, may be pregnant, or are planning to become pregnant.  If you drink alcohol: ? Limit how much you use to 0-1 drink a day. ? Limit intake if you are breastfeeding.  Be aware of how much alcohol is in your drink. In the [place]S., one drink equals one 12 oz bottle of beer (355 mL), one 5 oz glass of wine (148 mL), or one 1 oz glass of hard liquor (44 mL). General instructions  Schedule regular health, dental, and eye exams.  Stay current with your  vaccines.  Tell your health care provider if: ? You often feel depressed. ? You have ever been abused or do not feel safe at home. Summary  Adopting a healthy lifestyle and getting preventive care are important in promoting health and wellness.  Follow your health care provider's instructions about healthy diet, exercising, and getting tested or screened for diseases.  Follow your health care provider's instructions on monitoring your cholesterol and blood pressure. This information is not intended to replace advice given to you by your health care provider. Make sure you discuss any  questions you have with your health care provider. Document Revised: 11/21/2018 Document Reviewed: 11/21/2018 Elsevier Patient Education  [place].  Obesity, Adult Obesity is the condition of having too much total body fat. Being overweight or obese means that your weight is greater than what is considered healthy for your body size. Obesity is determined by a measurement called BMI. BMI is an estimate of body fat and is calculated from height and weight. For adults, a BMI of 30 or higher is considered obese. Obesity can lead to other health concerns and major illnesses, including:  Stroke.  Coronary artery disease (CAD).  Type 2 diabetes.  Some types of cancer, including cancers of the colon, breast, uterus, and gallbladder.  Osteoarthritis.  High blood pressure (hypertension).  High cholesterol.  Sleep apnea.  Gallbladder stones.  Infertility problems. What are the causes? Common causes of this condition include:  Eating daily meals that are high in calories, sugar, and fat.  Being born with genes that may make you more likely to become obese.  Having a medical condition that causes obesity, including: ? Hypothyroidism. ? Polycystic ovarian syndrome (PCOS). ? Binge-eating disorder. ? Cushing syndrome.  Taking certain medicines, such as steroids, antidepressants, and seizure  medicines.  Not being physically active (sedentary lifestyle).  Not getting enough sleep.  Drinking high amounts of sugar-sweetened beverages, such as soft drinks. What increases the risk? The following factors may make you more likely to develop this condition:  Having a family history of obesity.  Being a woman of African American descent.  Being a man of Hispanic descent.  Living in an area with limited access to: ? [place], recreation centers, or sidewalks. ? Healthy food choices, such as grocery stores and farmers' markets. What are the signs or symptoms? The main sign of this condition is having too much body fat. How is this diagnosed? This condition is diagnosed based on:  Your BMI. If you are an adult with a BMI of 30 or higher, you are considered obese.  Your waist circumference. This measures the distance around your waistline.  Your skinfold thickness. Your health care provider may gently pinch a fold of your skin and measure it. You may have other tests to check for underlying conditions. How is this treated? Treatment for this condition often includes changing your lifestyle. Treatment may include some or all of the following:  Dietary changes. This may include developing a healthy meal plan.  Regular physical activity. This may include activity that causes your heart to beat faster (aerobic exercise) and strength training. Work with your health care provider to design an exercise program that works for you.  Medicine to help you lose weight if you are unable to lose 1 pound a week after 6 weeks of healthy eating and more physical activity.  Treating conditions that cause the obesity (underlying conditions).  Surgery. Surgical options may include gastric banding and gastric bypass. Surgery may be done if: ? Other treatments have not helped to improve your condition. ? You have a BMI of 40 or higher. ? You have life-threatening health problems related to  obesity. Follow these instructions at home: Eating and drinking   Follow recommendations from your health care provider about what you eat and drink. Your health care provider may advise you to: ? Limit fast food, sweets, and processed snack foods. ? Choose low-fat options, such as low-fat milk instead of whole milk. ? Eat 5 or more servings of fruits or vegetables every day. ?  Eat at home more often. This gives you more control over what you eat. ? Choose healthy foods when you eat out. ? Learn to read food labels. This will help you understand how much food is considered 1 serving. ? Learn what a healthy serving size is. ? Keep low-fat snacks available. ? Limit sugary drinks, such as soda, fruit juice, sweetened iced tea, and flavored milk.  Drink enough water to keep your urine pale yellow.  Do not follow a fad diet. Fad diets can be unhealthy and even dangerous. Physical activity  Exercise regularly, as told by your health care provider. ? Most adults should get up to 150 minutes of moderate-intensity exercise every week. ? Ask your health care provider what types of exercise are safe for you and how often you should exercise.  Warm up and stretch before being active.  Cool down and stretch after being active.  Rest between periods of activity. Lifestyle  Work with your health care provider and a dietitian to set a weight-loss goal that is healthy and reasonable for you.  Limit your screen time.  Find ways to reward yourself that do not involve food.  Do not drink alcohol if: ? Your health care provider tells you not to drink. ? You are pregnant, may be pregnant, or are planning to become pregnant.  If you drink alcohol: ? Limit how much you use to:  0-1 drink a day for women.  0-2 drinks a day for men. ? Be aware of how much alcohol is in your drink. In the [place] one drink equals one 12 oz bottle of beer (355 mL), one 5 oz glass of wine (148 mL), or one 1 oz  glass of hard liquor (44 mL). General instructions  Keep a weight-loss journal to keep track of the food you eat and how much exercise you get.  Take over-the-counter and prescription medicines only as told by your health care provider.  Take vitamins and supplements only as told by your health care provider.  Consider joining a support group. Your health care provider may be able to recommend a support group.  Keep all follow-up visits as told by your health care provider. This is important. Contact a health care provider if:  You are unable to meet your weight loss goal after 6 weeks of dietary and lifestyle changes. Get help right away if you are having:  Trouble breathing.  Suicidal thoughts or behaviors. Summary  Obesity is the condition of having too much total body fat.  Being overweight or obese means that your weight is greater than what is considered healthy for your body size.  Work with your health care provider and a dietitian to set a weight-loss goal that is healthy and reasonable for you.  Exercise regularly, as told by your health care provider. Ask your health care provider what types of exercise are safe for you and how often you should exercise. This information is not intended to replace advice given to you by your health care provider. Make sure you discuss any questions you have with your health care provider. Document Revised: 08/02/2018 Document Reviewed: 08/02/2018 Elsevier Patient Education  2020 Elsevier Inc.  Preventive Care 82-50 Years Old, Female Preventive care refers to visits with your health care provider and lifestyle choices that can promote health and wellness. This includes:  A yearly physical exam. This may also be called an annual well check.  Regular dental visits and eye exams.  Immunizations.  Screening for  certain conditions.  Healthy lifestyle choices, such as eating a healthy diet, getting regular exercise, not using drugs or  products that contain nicotine and tobacco, and limiting alcohol use. What can I expect for my preventive care visit? Physical exam Your health care provider will check your:  Height and weight. This may be used to calculate body mass index (BMI), which tells if you are at a healthy weight.  Heart rate and blood pressure.  Skin for abnormal spots. Counseling Your health care provider may ask you questions about your:  Alcohol, tobacco, and drug use.  Emotional well-being.  Home and relationship well-being.  Sexual activity.  Eating habits.  Work and work Statistician.  Method of birth control.  Menstrual cycle.  Pregnancy history. What immunizations do I need?  Influenza (flu) vaccine  This is recommended every year. Tetanus, diphtheria, and pertussis (Tdap) vaccine  You may need a Td booster every 10 years. Varicella (chickenpox) vaccine  You may need this if you have not been vaccinated. Zoster (shingles) vaccine  You may need this after age 64. Measles, mumps, and rubella (MMR) vaccine  You may need at least one dose of MMR if you were born in 1957 or later. You may also need a second dose. Pneumococcal conjugate (PCV13) vaccine  You may need this if you have certain conditions and were not previously vaccinated. Pneumococcal polysaccharide (PPSV23) vaccine  You may need one or two doses if you smoke cigarettes or if you have certain conditions. Meningococcal conjugate (MenACWY) vaccine  You may need this if you have certain conditions. Hepatitis A vaccine  You may need this if you have certain conditions or if you travel or work in places where you may be exposed to hepatitis A. Hepatitis B vaccine  You may need this if you have certain conditions or if you travel or work in places where you may be exposed to hepatitis B. Haemophilus influenzae type b (Hib) vaccine  You may need this if you have certain conditions. Human papillomavirus (HPV)  vaccine  If recommended by your health care provider, you may need three doses over 6 months. You may receive vaccines as individual doses or as more than one vaccine together in one shot (combination vaccines). Talk with your health care provider about the risks and benefits of combination vaccines. What tests do I need? Blood tests  Lipid and cholesterol levels. These may be checked every 5 years, or more frequently if you are over 39 years old.  Hepatitis C test.  Hepatitis B test. Screening  Lung cancer screening. You may have this screening every year starting at age 66 if you have a 30-pack-year history of smoking and currently smoke or have quit within the past 15 years.  Colorectal cancer screening. All adults should have this screening starting at age 53 and continuing until age 10. Your health care provider may recommend screening at age 104 if you are at increased risk. You will have tests every 1-10 years, depending on your results and the type of screening test.  Diabetes screening. This is done by checking your blood sugar (glucose) after you have not eaten for a while (fasting). You may have this done every 1-3 years.  Mammogram. This may be done every 1-2 years. Talk with your health care provider about when you should start having regular mammograms. This may depend on whether you have a family history of breast cancer.  BRCA-related cancer screening. This may be done if you have a  family history of breast, ovarian, tubal, or peritoneal cancers.  Pelvic exam and Pap test. This may be done every 3 years starting at age 85. Starting at age 55, this may be done every 5 years if you have a Pap test in combination with an HPV test. Other tests  Sexually transmitted disease (STD) testing.  Bone density scan. This is done to screen for osteoporosis. You may have this scan if you are at high risk for osteoporosis. Follow these instructions at home: Eating and drinking  Eat a  diet that includes fresh fruits and vegetables, whole grains, lean protein, and low-fat dairy.  Take vitamin and mineral supplements as recommended by your health care provider.  Do not drink alcohol if: ? Your health care provider tells you not to drink. ? You are pregnant, may be pregnant, or are planning to become pregnant.  If you drink alcohol: ? Limit how much you have to 0-1 drink a day. ? Be aware of how much alcohol is in your drink. In the [place] one drink equals one 12 oz bottle of beer (355 mL), one 5 oz glass of wine (148 mL), or one 1 oz glass of hard liquor (44 mL). Lifestyle  Take daily care of your teeth and gums.  Stay active. Exercise for at least 30 minutes on 5 or more days each week.  Do not use any products that contain nicotine or tobacco, such as cigarettes, e-cigarettes, and chewing tobacco. If you need help quitting, ask your health care provider.  If you are sexually active, practice safe sex. Use a condom or other form of birth control (contraception) in order to prevent pregnancy and STIs (sexually transmitted infections).  If told by your health care provider, take low-dose aspirin daily starting at age 55. What's next?  Visit your health care provider once a year for a well check visit.  Ask your health care provider how often you should have your eyes and teeth checked.  Stay up to date on all vaccines. This information is not intended to replace advice given to you by your health care provider. Make sure you discuss any questions you have with your health care provider. Document Revised: 08/09/2018 Document Reviewed: 08/09/2018 Elsevier Patient Education  [place].  Preventing Hypertension Hypertension, commonly called high blood pressure, is when the force of blood pumping through the arteries is too strong. Arteries are blood vessels that carry blood from the heart throughout the body. Over time, hypertension can damage the arteries and  decrease blood flow to important parts of the body, including the brain, heart, and kidneys. Often, hypertension does not cause symptoms until blood pressure is very high. For this reason, it is important to have your blood pressure checked on a regular basis. Hypertension can often be prevented with diet and lifestyle changes. If you already have hypertension, you can control it with diet and lifestyle changes, as well as medicine. What nutrition changes can be made? Maintain a healthy diet. This includes:  Eating less salt (sodium). Ask your health care provider how much sodium is safe for you to have. The general recommendation is to consume less than 1 tsp (2,300 mg) of sodium a day. ? Do not add salt to your food. ? Choose low-sodium options when grocery shopping and eating out.  Limiting fats in your diet. You can do this by eating low-fat or fat-free dairy products and by eating less red meat.  Eating more fruits, vegetables, and whole grains.  Make a goal to eat: ? 1-2 cups of fresh fruits and vegetables each day. ? 3-4 servings of whole grains each day.  Avoiding foods and beverages that have added sugars.  Eating fish that contain healthy fats (omega-3 fatty acids), such as mackerel or salmon. If you need help putting together a healthy eating plan, try the DASH diet. This diet is high in fruits, vegetables, and whole grains. It is low in sodium, red meat, and added sugars. DASH stands for Dietary Approaches to Stop Hypertension. What lifestyle changes can be made?   Lose weight if you are overweight. Losing just 3?5% of your body weight can help prevent or control hypertension. ? For example, if your present weight is 200 lb (91 kg), a loss of 3-5% of your weight means losing 6-10 lb (2.7-4.5 kg). ? Ask your health care provider to help you with a diet and exercise plan to safely lose weight.  Get enough exercise. Do at least 150 minutes of moderate-intensity exercise each  week. ? You could do this in short exercise sessions several times a day, or you could do longer exercise sessions a few times a week. For example, you could take a brisk 10-minute walk or bike ride, 3 times a day, for 5 days a week.  Find ways to reduce stress, such as exercising, meditating, listening to music, or taking a yoga class. If you need help reducing stress, ask your health care provider.  Do not smoke. This includes e-cigarettes. Chemicals in tobacco and nicotine products raise your blood pressure each time you smoke. If you need help quitting, ask your health care provider.  Avoid alcohol. If you drink alcohol, limit alcohol intake to no more than 1 drink a day for nonpregnant women and 2 drinks a day for men. One drink equals 12 oz of beer, 5 oz of wine, or 1 oz of hard liquor. Why are these changes important? Diet and lifestyle changes can help you prevent hypertension, and they may make you feel better overall and improve your quality of life. If you have hypertension, making these changes will help you control it and help prevent major complications, such as:  Hardening and narrowing of arteries that supply blood to: ? Your heart. This can cause a heart attack. ? Your brain. This can cause a stroke. ? Your kidneys. This can cause kidney failure.  Stress on your heart muscle, which can cause heart failure. What can I do to lower my risk?  Work with your health care provider to make a hypertension prevention plan that works for you. Follow your plan and keep all follow-up visits as told by your health care provider.  Learn how to check your blood pressure at home. Make sure that you know your personal target blood pressure, as told by your health care provider. How is this treated? In addition to diet and lifestyle changes, your health care provider may recommend medicines to help lower your blood pressure. You may need to try a few different medicines to find what works best  for you. You also may need to take more than one medicine. Take over-the-counter and prescription medicines only as told by your health care provider. Where to find support Your health care provider can help you prevent hypertension and help you keep your blood pressure at a healthy level. Your local hospital or your community may also provide support services and prevention programs. The American Heart Association offers an online support network at: CheapBootlegs.com.cy Where  to find more information Learn more about hypertension from:  [place], [facility], [facility] Blood Institute: ElectronicHangman.is  Centers for Disease Control and Prevention: https://ingram.com/  American Academy of Family Physicians: http://familydoctor.org/familydoctor/en/diseases-conditions/high-blood-pressure.printerview.all.html Learn more about the DASH diet from:  [place], [facility] [place]: https://www.reyes.com/ Contact a health care provider if:  You think you are having a reaction to medicines you have taken.  You have recurrent headaches or feel dizzy.  You have swelling in your ankles.  You have trouble with your vision. Summary  Hypertension often does not cause any symptoms until blood pressure is very high. It is important to get your blood pressure checked regularly.  Diet and lifestyle changes are the most important steps in preventing hypertension.  By keeping your blood pressure in a healthy range, you can prevent complications like heart attack, heart failure, stroke, and kidney failure.  Work with your health care provider to make a hypertension prevention plan that works for you. This information is not intended to replace advice given to you by your health care provider. Make sure you discuss any questions you have with your health care provider. Document Revised: 03/22/2019  Document Reviewed: 08/08/2016 Elsevier Patient Education  2020 [place] [employer] Calories are units of energy. Your body needs a certain amount of calories from food to keep you going throughout the day. When you eat more calories than your body needs, your body stores the extra calories as fat. When you eat fewer calories than your body needs, your body burns fat to get the energy it needs. Calorie counting means keeping track of how many calories you eat and drink each day. Calorie counting can be helpful if you need to lose weight. If you make sure to eat fewer calories than your body needs, you should lose weight. Ask your health care provider what a healthy weight is for you. For calorie counting to work, you will need to eat the right number of calories in a day in order to lose a healthy amount of weight per week. A dietitian can help you determine how many calories you need in a day and will give you suggestions on how to reach your calorie goal.  A healthy amount of weight to lose per week is usually 1-2 lb (0.5-0.9 kg). This usually means that your daily calorie intake should be reduced by 500-750 calories.  Eating 1,200 - 1,500 calories per day can help most women lose weight.  Eating 1,500 - 1,800 calories per day can help most men lose weight. What is my plan? My goal is to have __________ calories per day. If I have this many calories per day, I should lose around __________ pounds per week. What do I need to know about calorie counting? In order to meet your daily calorie goal, you will need to:  Find out how many calories are in each food you would like to eat. Try to do this before you eat.  Decide how much of the food you plan to eat.  Write down what you ate and how many calories it had. Doing this is called keeping a food log. To successfully lose weight, it is important to balance calorie counting with a healthy lifestyle that includes  regular activity. Aim for 150 minutes of moderate exercise (such as walking) or 75 minutes of vigorous exercise (such as running) each week. Where do I find calorie information?  The number of calories in a food can be  found on a Nutrition Facts label. If a food does not have a Nutrition Facts label, try to look up the calories online or ask your dietitian for help. Remember that calories are listed per serving. If you choose to have more than one serving of a food, you will have to multiply the calories per serving by the amount of servings you plan to eat. For example, the label on a package of bread might say that a serving size is 1 slice and that there are 90 calories in a serving. If you eat 1 slice, you will have eaten 90 calories. If you eat 2 slices, you will have eaten 180 calories. How do I keep a food log? Immediately after each meal, record the following information in your food log:  What you ate. Don't forget to include toppings, sauces, and other extras on the food.  How much you ate. This can be measured in cups, ounces, or number of items.  How many calories each food and drink had.  The total number of calories in the meal. Keep your food log near you, such as in a small notebook in your pocket, or use a mobile app or website. Some programs will calculate calories for you and show you how many calories you have left for the day to meet your goal. What are some calorie counting tips?   Use your calories on foods and drinks that will fill you up and not leave you hungry: ? Some examples of foods that fill you up are nuts and nut butters, vegetables, lean proteins, and high-fiber foods like whole grains. High-fiber foods are foods with more than 5 g fiber per serving. ? Drinks such as sodas, specialty coffee drinks, alcohol, and juices have a lot of calories, yet do not fill you up.  Eat nutritious foods and avoid empty calories. Empty calories are calories you get from foods  or beverages that do not have many vitamins or protein, such as candy, sweets, and soda. It is better to have a nutritious high-calorie food (such as an avocado) than a food with few nutrients (such as a bag of chips).  Know how many calories are in the foods you eat most often. This will help you calculate calorie counts faster.  Pay attention to calories in drinks. Low-calorie drinks include water and unsweetened drinks.  Pay attention to nutrition labels for "low fat" or "fat free" foods. These foods sometimes have the same amount of calories or more calories than the full fat versions. They also often have added sugar, starch, or salt, to make up for flavor that was removed with the fat.  Find a way of tracking calories that works for you. Get creative. Try different apps or programs if writing down calories does not work for you. What are some portion control tips?  Know how many calories are in a serving. This will help you know how many servings of a certain food you can have.  Use a measuring cup to measure serving sizes. You could also try weighing out portions on a kitchen scale. With time, you will be able to estimate serving sizes for some foods.  Take some time to put servings of different foods on your favorite plates, bowls, and cups so you know what a serving looks like.  Try not to eat straight from a bag or box. Doing this can lead to overeating. Put the amount you would like to eat in a cup or on a  plate to make sure you are eating the right portion.  Use smaller plates, glasses, and bowls to prevent overeating.  Try not to multitask (for example, watch TV or use your computer) while eating. If it is time to eat, sit down at a table and enjoy your food. This will help you to know when you are full. It will also help you to be aware of what you are eating and how much you are eating. What are tips for following this plan? Reading food labels  Check the calorie count  compared to the serving size. The serving size may be smaller than what you are used to eating.  Check the source of the calories. Make sure the food you are eating is high in vitamins and protein and low in saturated and trans fats. Shopping  Read nutrition labels while you shop. This will help you make healthy decisions before you decide to purchase your food.  Make a grocery list and stick to it. Cooking  Try to cook your favorite foods in a healthier way. For example, try baking instead of frying.  Use low-fat dairy products. Meal planning  Use more fruits and vegetables. Half of your plate should be fruits and vegetables.  Include lean proteins like poultry and fish. How do I count calories when eating out?  Ask for smaller portion sizes.  Consider sharing an entree and sides instead of getting your own entree.  If you get your own entree, eat only half. Ask for a box at the beginning of your meal and put the rest of your entree in it so you are not tempted to eat it.  If calories are listed on the menu, choose the lower calorie options.  Choose dishes that include vegetables, fruits, whole grains, low-fat dairy products, and lean protein.  Choose items that are boiled, broiled, grilled, or steamed. Stay away from items that are buttered, battered, fried, or served with cream sauce. Items labeled "crispy" are usually fried, unless stated otherwise.  Choose water, low-fat milk, unsweetened iced tea, or other drinks without added sugar. If you want an alcoholic beverage, choose a lower calorie option such as a glass of wine or light beer.  Ask for dressings, sauces, and syrups on the side. These are usually high in calories, so you should limit the amount you eat.  If you want a salad, choose a garden salad and ask for grilled meats. Avoid extra toppings like bacon, cheese, or fried items. Ask for the dressing on the side, or ask for olive oil and vinegar or lemon to use as  dressing.  Estimate how many servings of a food you are given. For example, a serving of cooked rice is  cup or about the size of half a baseball. Knowing serving sizes will help you be aware of how much food you are eating at restaurants. The list below tells you how big or small some common portion sizes are based on everyday objects: ? 1 oz--4 stacked dice. ? 3 oz--1 deck of cards. ? 1 tsp--1 die. ? 1 Tbsp-- a ping-pong ball. ? 2 Tbsp--1 ping-pong ball. ?  cup-- baseball. ? 1 cup--1 baseball. Summary  Calorie counting means keeping track of how many calories you eat and drink each day. If you eat fewer calories than your body needs, you should lose weight.  A healthy amount of weight to lose per week is usually 1-2 lb (0.5-0.9 kg). This usually means reducing your daily calorie intake  by 500-750 calories.  The number of calories in a food can be found on a Nutrition Facts label. If a food does not have a Nutrition Facts label, try to look up the calories online or ask your dietitian for help.  Use your calories on foods and drinks that will fill you up, and not on foods and drinks that will leave you hungry.  Use smaller plates, glasses, and bowls to prevent overeating. This information is not intended to replace advice given to you by your health care provider. Make sure you discuss any questions you have with your health care provider. Document Revised: 08/17/2018 Document Reviewed: 10/28/2016 Elsevier Patient Education  [place].  High Arch Feet  A high arch foot is a condition in which the arch of the foot does not touch the ground when you are standing or walking. As a result, more weight is put on the parts of the foot that touch the ground, including the front, outer side, and heel of the foot. The medical term for this condition is cavus foot. It can affect one foot or both feet. Having high arches can be painful and unstable. What are the causes? This condition  may be caused by:  Being born with a high arch in one foot or both feet.  Being born with a foot that turns inward (club foot).  Having a broken foot that does not heal properly.  Having a nervous system (neurological) condition such as stroke, polio, cerebral palsy, or muscular dystrophy. In this case, high arches tend to get worse over time. Sometimes the cause of high arch foot is not known. What are the signs or symptoms? This condition can range from mild to severe. People with mild high arches may have few symptoms. More severe high arches may cause:  An unstable ankle with frequent ankle sprains or strains.  Pain when walking or standing.  Calluses on the front, side, and heel of the foot.  Toe deformities.  The foot to drag (foot drop). This can happen if the high arch is caused by a neurological condition, which can cause the muscles that lift the foot to become weak. How is this diagnosed? This condition is diagnosed based on:  Your medical and family history.  A physical exam. Your health care provider will examine your feet and observe your feet while you walk and stand. Your provider may test your foot strength and movement. X-rays or other imaging tests may be done to learn more about your condition. If your health care provider suspects that you have a neurological condition, you may need to see a provider who specializes in the nervous system (neurologist). How is this treated? The goal of treatment is to prevent pain and stabilize the foot. First treatments may include:  A shoe insert to support and cushion the foot (orthotic device).  Supportive shoes with high ankle support and a wide base.  A brace to support a weak or unstable ankle.  Physical therapy to strengthen weak muscles and loosen tight muscles. If these treatments do not help, you may need surgery. This is more likely when high arches are caused by a neurological condition that is getting  worse. Follow these instructions at home: General instructions  Take over-the-counter and prescription medicines only as told by your health care provider.  Use a shoe insert or supportive shoes as told by your health care provider.  Return to your normal activities as told by your health care provider.  Ask your health care provider what activities are safe for you.  Keep all follow-up visits as told by your health care provider. This is important. If you have a brace:  Wear the brace as told by your health care provider. Remove it only as told by your health care provider.  Loosen the brace if your toes tingle, become numb, or turn cold and blue.  Keep the brace clean.  Do not let the brace get wet if it is not waterproof.  Ask your health care provider when it is safe to drive if you have a brace on your foot. Contact a health care provider if:  You continue to have pain when walking or standing.  Your foot or ankle feels weak and unstable.  You sprain your ankle. Summary  A high arch foot is a condition in which the arch of the foot does not touch the ground when you are standing or walking.  A high arch foot can be painful and unstable.  This condition is diagnosed with a physical exam and other tests.  Treatment may include shoe inserts, special shoes, bracing, physical therapy, or surgery. This information is not intended to replace advice given to you by your health care provider. Make sure you discuss any questions you have with your health care provider. Document Revised: 03/21/2019 Document Reviewed: 01/31/2018 Elsevier Patient Education  2020 [employer].

## 2020-10-05 ENCOUNTER — Ambulatory Visit: Payer: Commercial Managed Care - PPO | Admitting: Family Medicine

## 2020-10-12 ENCOUNTER — Ambulatory Visit: Payer: Commercial Managed Care - PPO | Admitting: Family Medicine

## 2020-12-08 ENCOUNTER — Ambulatory Visit: Payer: Commercial Managed Care - PPO | Admitting: Family Medicine

## 2021-01-08 ENCOUNTER — Ambulatory Visit (INDEPENDENT_AMBULATORY_CARE_PROVIDER_SITE_OTHER): Payer: Commercial Managed Care - PPO | Admitting: Family Medicine

## 2021-01-08 ENCOUNTER — Other Ambulatory Visit: Payer: Self-pay

## 2021-01-08 ENCOUNTER — Encounter: Payer: Self-pay | Admitting: Family Medicine

## 2021-01-08 VITALS — BP 124/86 | HR 83 | Temp 98.3°F | Ht 66.0 in | Wt 242.6 lb

## 2021-01-08 DIAGNOSIS — M542 Cervicalgia: Secondary | ICD-10-CM

## 2021-01-08 DIAGNOSIS — Z2821 Immunization not carried out because of patient refusal: Secondary | ICD-10-CM | POA: Diagnosis not present

## 2021-01-08 MED ORDER — CYCLOBENZAPRINE HCL 10 MG PO TABS: 10.0000 mg | ORAL_TABLET | Freq: Three times a day (TID) | ORAL | 1 refills | Status: DC | PRN

## 2021-01-08 MED ORDER — NAPROXEN 500 MG PO TABS: 500.0000 mg | ORAL_TABLET | Freq: Two times a day (BID) | ORAL | 1 refills | Status: DC
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
## 2021-01-08 NOTE — Patient Instructions (Addendum)
Heat 3x/day 15-20 min Exercises after heat at least daily Naproxen 500mg  2x/day with food x 5-7 days Flexeril 10mg  1 tab at bedtime nightly x 4-5 nights then as needed    Neck Exercises Ask your health care provider which exercises are safe for you. Do exercises exactly as told by your health care provider and adjust them as directed. It is normal to feel mild stretching, pulling, tightness, or discomfort as you do these exercises. Stop right away if you feel sudden pain or your pain gets worse. Do not begin these exercises until told by your health care provider. Neck exercises can be important for many reasons. They can improve strength and maintain flexibility in your neck, which will help your upper back and prevent neck pain. Stretching exercises Rotation neck stretching 1. Sit in a chair or stand up. 2. Place your feet flat on the floor, shoulder width apart. 3. Slowly turn your head (rotate) to the right until a slight stretch is felt. Turn it all the way to the right so you can look over your right shoulder. Do not tilt or tip your head. 4. Hold this position for 10-30 seconds. 5. Slowly turn your head (rotate) to the left until a slight stretch is felt. Turn it all the way to the left so you can look over your left shoulder. Do not tilt or tip your head. 6. Hold this position for 10-30 seconds. Repeat __________ times. Complete this exercise __________ times a day.   Neck retraction 1. Sit in a sturdy chair or stand up. 2. Look straight ahead. Do not bend your neck. 3. Use your fingers to push your chin backward (retraction). Do not bend your neck for this movement. Continue to face straight ahead. If you are doing the exercise properly, you will feel a slight sensation in your throat and a stretch at the back of your neck. 4. Hold the stretch for 1-2 seconds. Repeat __________ times. Complete this exercise __________ times a day. Strengthening exercises Neck press 1. Lie on your  back on a firm bed or on the floor with a pillow under your head. 2. Use your neck muscles to push your head down on the pillow and straighten your spine. 3. Hold the position as well as you can. Keep your head facing up (in a neutral position) and your chin tucked. 4. Slowly count to 5 while holding this position. Repeat __________ times. Complete this exercise __________ times a day. Isometrics These are exercises in which you strengthen the muscles in your neck while keeping your neck still (isometrics). 1. Sit in a supportive chair and place your hand on your forehead. 2. Keep your head and face facing straight ahead. Do not flex or extend your neck while doing isometrics. 3. Push forward with your head and neck while pushing back with your hand. Hold for 10 seconds. 4. Do the sequence again, this time putting your hand against the back of your head. Use your head and neck to push backward against the hand pressure. 5. Finally, do the same exercise on either side of your head, pushing sideways against the pressure of your hand. Repeat __________ times. Complete this exercise __________ times a day. Prone head lifts 1. Lie face-down (prone position), resting on your elbows so that your chest and upper back are raised. 2. Start with your head facing downward, near your chest. Position your chin either on or near your chest. 3. Slowly lift your head upward. Lift until you  are looking straight ahead. Then continue lifting your head as far back as you can comfortably stretch. 4. Hold your head up for 5 seconds. Then slowly lower it to your starting position. Repeat __________ times. Complete this exercise __________ times a day. Supine head lifts 1. Lie on your back (supine position), bending your knees to point to the ceiling and keeping your feet flat on the floor. 2. Lift your head slowly off the floor, raising your chin toward your chest. 3. Hold for 5 seconds. Repeat __________ times.  Complete this exercise __________ times a day. Scapular retraction 1. Stand with your arms at your sides. Look straight ahead. 2. Slowly pull both shoulders (scapulae) backward and downward (retraction) until you feel a stretch between your shoulder blades in your upper back. 3. Hold for 10-30 seconds. 4. Relax and repeat. Repeat __________ times. Complete this exercise __________ times a day. Contact a health care provider if:  Your neck pain or discomfort gets much worse when you do an exercise.  Your neck pain or discomfort does not improve within 2 hours after you exercise. If you have any of these problems, stop exercising right away. Do not do the exercises again unless your health care provider says that you can. Get help right away if:  You develop sudden, severe neck pain. If this happens, stop exercising right away. Do not do the exercises again unless your health care provider says that you can. This information is not intended to replace advice given to you by your health care provider. Make sure you discuss any questions you have with your health care provider. Document Revised: 09/26/2018 Document Reviewed: 09/26/2018 Elsevier Patient Education  2021 [employer].

## 2022-06-07 ENCOUNTER — Encounter: Payer: Self-pay | Admitting: Gastroenterology

## 2022-06-08 ENCOUNTER — Other Ambulatory Visit: Payer: Self-pay | Admitting: *Deleted

## 2022-07-04 ENCOUNTER — Ambulatory Visit (AMBULATORY_SURGERY_CENTER): Payer: Self-pay | Admitting: *Deleted

## 2022-07-04 VITALS — Ht 66.0 in | Wt 237.0 lb

## 2022-07-04 DIAGNOSIS — Z1211 Encounter for screening for malignant neoplasm of colon: Secondary | ICD-10-CM

## 2022-07-04 MED ORDER — NA SULFATE-K SULFATE-MG SULF 17.5-3.13-1.6 GM/177ML PO SOLN: 1.0000 | ORAL | 0 refills | Status: DC

## 2022-07-04 NOTE — Progress Notes (Signed)
Patient is here in-person for PV. Patient denies any allergies to eggs or soy. Patient denies any problems with anesthesia/sedation. Patient is not on any oxygen at home. Patient is not taking any diet/weight loss medications or blood thinners. Went over procedure prep instructions with the patient. Patient is aware of our care-partner policy. Patient notified to use Singlecare card given to pt for prescription.

## 2022-07-19 DIAGNOSIS — Z6838 Body mass index (BMI) 38.0-38.9, adult: Secondary | ICD-10-CM | POA: Diagnosis not present

## 2022-07-19 DIAGNOSIS — Z01411 Encounter for gynecological examination (general) (routine) with abnormal findings: Secondary | ICD-10-CM | POA: Diagnosis not present

## 2022-07-19 DIAGNOSIS — Z0142 Encounter for cervical smear to confirm findings of recent normal smear following initial abnormal smear: Secondary | ICD-10-CM | POA: Diagnosis not present

## 2022-07-19 DIAGNOSIS — Z113 Encounter for screening for infections with a predominantly sexual mode of transmission: Secondary | ICD-10-CM | POA: Diagnosis not present

## 2022-07-19 DIAGNOSIS — Z01419 Encounter for gynecological examination (general) (routine) without abnormal findings: Secondary | ICD-10-CM | POA: Diagnosis not present

## 2022-07-19 DIAGNOSIS — Z1329 Encounter for screening for other suspected endocrine disorder: Secondary | ICD-10-CM | POA: Diagnosis not present

## 2022-07-19 DIAGNOSIS — Z124 Encounter for screening for malignant neoplasm of cervix: Secondary | ICD-10-CM | POA: Diagnosis not present

## 2022-07-20 ENCOUNTER — Encounter: Payer: Self-pay | Admitting: Gastroenterology

## 2022-07-25 ENCOUNTER — Ambulatory Visit (AMBULATORY_SURGERY_CENTER): Payer: BC Managed Care – PPO | Admitting: Gastroenterology

## 2022-07-25 ENCOUNTER — Encounter: Payer: Self-pay | Admitting: Gastroenterology

## 2022-07-25 VITALS — BP 132/85 | HR 59 | Temp 97.1°F | Resp 16 | Ht 66.0 in | Wt 237.0 lb

## 2022-07-25 DIAGNOSIS — Z1211 Encounter for screening for malignant neoplasm of colon: Secondary | ICD-10-CM

## 2022-07-25 DIAGNOSIS — D125 Benign neoplasm of sigmoid colon: Secondary | ICD-10-CM | POA: Diagnosis not present

## 2022-07-25 DIAGNOSIS — K5730 Diverticulosis of large intestine without perforation or abscess without bleeding: Secondary | ICD-10-CM

## 2022-07-25 MED ORDER — SODIUM CHLORIDE 0.9 % IV SOLN: 500.0000 mL | Freq: Once | INTRAVENOUS | Status: DC

## 2022-07-25 NOTE — Progress Notes (Unsigned)
Report to PACU, RN, vss, BBS= Clear.  

## 2022-07-25 NOTE — Progress Notes (Unsigned)
VS by CW  Pt's states no medical or surgical changes since previsit or office visit.  

## 2022-07-25 NOTE — Op Note (Signed)
[facility] Patient Name: Monroe Toure Procedure Date: 07/25/2022 8:25 AM MRN: [id number] Endoscopist: Gerrit Heck , MD Age: 51 Referring MD:  Date of Birth: 1971/11/18 Gender: Female Account #: [id number] Procedure:                Colonoscopy Indications:              Screening for colorectal malignant neoplasm, This                            is the patient's first colonoscopy Medicines:                Monitored Anesthesia Care Procedure:                Pre-Anesthesia Assessment:                           - Prior to the procedure, a History and Physical                            was performed, and patient medications and                            allergies were reviewed. The patient's tolerance of                            previous anesthesia was also reviewed. The risks                            and benefits of the procedure and the sedation                            options and risks were discussed with the patient.                            All questions were answered, and informed consent                            was obtained. Prior Anticoagulants: The patient has                            taken no previous anticoagulant or antiplatelet                            agents. ASA Grade Assessment: II - A patient with                            mild systemic disease. After reviewing the risks                            and benefits, the patient was deemed in                            satisfactory condition to undergo the procedure.  After obtaining informed consent, the colonoscope                            was passed under direct vision. Throughout the                            procedure, the patient's blood pressure, pulse, and                            oxygen saturations were monitored continuously. The                            CF [id number] #[id number] was introduced through the anus                            and advanced to the the  terminal ileum. The                            colonoscopy was performed without difficulty. The                            patient tolerated the procedure well. The quality                            of the bowel preparation was good. The terminal                            ileum, ileocecal valve, appendiceal orifice, and                            rectum were photographed. Scope In: 8:33:29 AM Scope Out: 8:54:08 AM Scope Withdrawal Time: 0 hours 16 minutes 15 seconds  Total Procedure Duration: 0 hours 20 minutes 39 seconds  Findings:                 The perianal and digital rectal examinations were                            normal.                           Two sessile and semi-pedunculated polyps were found                            in the distal sigmoid colon. The polyps were 2 to 6                            mm in size. These polyps were removed with a cold                            snare. Resection and retrieval were complete.                            Estimated blood loss was minimal.  Multiple small-mouthed diverticula were found in                            the sigmoid colon.                           The exam was otherwise normal throughout the                            remainder of the colon.                           The retroflexed view of the distal rectum and anal                            verge was normal and showed no anal or rectal                            abnormalities.                           The terminal ileum appeared normal. Complications:            No immediate complications. Estimated Blood Loss:     Estimated blood loss was minimal. Impression:               - Two 2 to 6 mm polyps in the distal sigmoid colon,                            removed with a cold snare. Resected and retrieved.                           - Diverticulosis in the sigmoid colon.                           - The distal rectum and anal verge are normal on                             retroflexion view.                           - The examined portion of the ileum was normal. Recommendation:           - Patient has a contact number available for                            emergencies. The signs and symptoms of potential                            delayed complications were discussed with the                            patient. Return to normal activities tomorrow.                            Written discharge  instructions were provided to the                            patient.                           - Resume previous diet.                           - Continue present medications.                           - Await pathology results.                           - Repeat colonoscopy for surveillance based on                            pathology results.                           - Return to GI office PRN. Gerrit Heck, MD 07/25/2022 8:59:53 AM

## 2022-07-25 NOTE — Progress Notes (Unsigned)
GASTROENTEROLOGY PROCEDURE H&P NOTE   Primary Care Physician: Libby Maw, MD    Reason for Procedure:  Colon Cancer screening  Plan:    Colonoscopy  Patient is appropriate for endoscopic procedure(s) in the ambulatory ([facility]) setting.  The nature of the procedure, as well as the risks, benefits, and alternatives were carefully and thoroughly reviewed with the patient. Ample time for discussion and questions allowed. The patient understood, was satisfied, and agreed to proceed.     HPI: Morgan Norris is a 51 y.o. female who presents for colonoscopy for routine Colon Cancer screening.  No active GI symptoms.  Fhx of mother with colon poylps. No known Fhx of CRC.   Past Medical History:  Diagnosis Date   Allergy    Depression 2015   Post-operative nausea and vomiting    Seasonal allergies     Past Surgical History:  Procedure Laterality Date   APPENDECTOMY     CESAREAN SECTION     chin inplant     EYE SURGERY     RHINOPLASTY     WISDOM TOOTH EXTRACTION      Prior to Admission medications   Medication Sig Start Date End Date Taking? Authorizing Provider  levocetirizine (XYZAL) 5 MG tablet Take 5 mg by mouth every evening.   Yes [provider]  Multiple Vitamins-Minerals (MULTIVITAMIN GUMMIES ADULT PO) Take by mouth.   Yes [provider]    Current Outpatient Medications  Medication Sig Dispense Refill   levocetirizine (XYZAL) 5 MG tablet Take 5 mg by mouth every evening.     Multiple Vitamins-Minerals (MULTIVITAMIN GUMMIES ADULT PO) Take by mouth.     Current Facility-Administered Medications  Medication Dose Route Frequency Provider Last Rate Last Admin   0.9 %  sodium chloride infusion  500 mL Intravenous Once Vali Capano V, DO        Allergies as of 07/25/2022 - Review Complete 07/25/2022  Allergen Reaction Noted   Penicillins Other (See Comments) and Nausea And Vomiting 09/07/2012    Family History  Problem Relation  Age of Onset   Colon polyps Mother    Hypothyroidism Mother    Hypertension Father    Hyperlipidemia Father    Colon cancer Neg Hx    Esophageal cancer Neg Hx    Stomach cancer Neg Hx    Rectal cancer Neg Hx     Social History   Socioeconomic History   Marital status: Divorced    Spouse name: Not on file   Number of children: Not on file   Years of education: Not on file   Highest education level: Not on file  Occupational History   Not on file  Tobacco Use   Smoking status: Never   Smokeless tobacco: Never  Vaping Use   Vaping Use: Never used  Substance and Sexual Activity   Alcohol use: Yes    Comment: once a month per pt   Drug use: No   Sexual activity: Not on file  Other Topics Concern   Not on file  Social History Narrative   Not on file   Social Determinants of Health   Financial Resource Strain: Not on file  Food Insecurity: Not on file  Transportation Needs: Not on file  Physical Activity: Not on file  Stress: Not on file  Social Connections: Not on file  Intimate Partner Violence: Not on file    Physical Exam: Vital signs in last 24 hours: '@BP'$  (!) 161/96   Pulse  79   Temp (!) 97.1 F (36.2 C)   Resp 14   Ht '5\' 6"'$  (1.676 m)   Wt 237 lb (107.5 kg)   SpO2 100%   BMI 38.25 kg/m  GEN: NAD EYE: Sclerae anicteric ENT: MMM CV: Non-tachycardic Pulm: CTA b/l GI: Soft, NT/ND NEURO:  Alert & Oriented x 3   Gerrit Heck, DO [facility]   07/25/2022 8:25 AM

## 2022-07-25 NOTE — Patient Instructions (Signed)
Please read handouts provided. Continue present medications. Await pathology results. Return to GI office as needed.   YOU HAD AN ENDOSCOPIC PROCEDURE TODAY AT [facility]:   Refer to the procedure report that was given to you for any specific questions about what was found during the examination.  If the procedure report does not answer your questions, please call your gastroenterologist to clarify.  If you requested that your care partner not be given the details of your procedure findings, then the procedure report has been included in a sealed envelope for you to review at your convenience later.  YOU SHOULD EXPECT: Some feelings of bloating in the abdomen. Passage of more gas than usual.  Walking can help get rid of the air that was put into your GI tract during the procedure and reduce the bloating. If you had a lower endoscopy (such as a colonoscopy or flexible sigmoidoscopy) you may notice spotting of blood in your stool or on the toilet paper. If you underwent a bowel prep for your procedure, you may not have a normal bowel movement for a few days.  Please Note:  You might notice some irritation and congestion in your nose or some drainage.  This is from the oxygen used during your procedure.  There is no need for concern and it should clear up in a day or so.  SYMPTOMS TO REPORT IMMEDIATELY:  Following lower endoscopy (colonoscopy or flexible sigmoidoscopy):  Excessive amounts of blood in the stool  Significant tenderness or worsening of abdominal pains  Swelling of the abdomen that is new, acute  Fever of 100F or higher  For urgent or emergent issues, a gastroenterologist can be reached at any hour by calling [phone]. Do not use MyChart messaging for urgent concerns.    DIET:  We do recommend a small meal at first, but then you may proceed to your regular diet.  Drink plenty of fluids but you should avoid alcoholic beverages for 24 hours.  ACTIVITY:  You  should plan to take it easy for the rest of today and you should NOT DRIVE or use heavy machinery until tomorrow (because of the sedation medicines used during the test).    FOLLOW UP: Our staff will call the number listed on your records the next business day following your procedure.  We will call around 7:15- 8:00 am to check on you and address any questions or concerns that you may have regarding the information given to you following your procedure. If we do not reach you, we will leave a message.  If you develop any symptoms (ie: fever, flu-like symptoms, shortness of breath, cough etc.) before then, please call [phone].  If you test positive for Covid 19 in the 2 weeks post procedure, please call and report this information to [place].    If any biopsies were taken you will be contacted by phone or by letter within the next 1-3 weeks.  Please call us at [phone] if you have not heard about the biopsies in 3 weeks.    SIGNATURES/CONFIDENTIALITY: You and/or your care partner have signed paperwork which will be entered into your electronic medical record.  These signatures attest to the fact that that the information above on your After Visit Summary has been reviewed and is understood.  Full responsibility of the confidentiality of this discharge information lies with you and/or your care-partner.

## 2022-07-25 NOTE — Progress Notes (Signed)
Called to room to assist during endoscopic procedure.  Patient ID and intended procedure confirmed with present staff. Received instructions for my participation in the procedure from the performing physician.  

## 2022-07-26 ENCOUNTER — Telehealth: Payer: Self-pay | Admitting: *Deleted

## 2022-07-26 NOTE — Telephone Encounter (Signed)
  Follow up Call-     07/25/2022    7:38 AM  Call back number  Post procedure Call Back phone  # [phone]  Permission to leave phone message Yes     Patient questions:  Do you have a fever, pain , or abdominal swelling? No. Pain Score  0 *  Have you tolerated food without any problems? Yes.    Have you been able to return to your normal activities? Yes.    Do you have any questions about your discharge instructions: Diet   No. Medications  No. Follow up visit  No.  Do you have questions or concerns about your Care? No.  Actions: * If pain score is 4 or above: No action needed, pain <4.

## 2022-08-04 ENCOUNTER — Encounter: Payer: Self-pay | Admitting: Gastroenterology

## 2022-09-09 DIAGNOSIS — R6882 Decreased libido: Secondary | ICD-10-CM | POA: Diagnosis not present

## 2022-09-09 DIAGNOSIS — E669 Obesity, unspecified: Secondary | ICD-10-CM | POA: Diagnosis not present

## 2022-09-09 DIAGNOSIS — R635 Abnormal weight gain: Secondary | ICD-10-CM | POA: Diagnosis not present

## 2022-09-09 DIAGNOSIS — Z833 Family history of diabetes mellitus: Secondary | ICD-10-CM | POA: Diagnosis not present

## 2022-09-23 DIAGNOSIS — Z1231 Encounter for screening mammogram for malignant neoplasm of breast: Secondary | ICD-10-CM | POA: Diagnosis not present

## 2022-10-04 DIAGNOSIS — N951 Menopausal and female climacteric states: Secondary | ICD-10-CM | POA: Diagnosis not present

## 2023-05-24 DIAGNOSIS — E669 Obesity, unspecified: Secondary | ICD-10-CM | POA: Diagnosis not present

## 2023-05-24 DIAGNOSIS — E8881 Metabolic syndrome: Secondary | ICD-10-CM | POA: Diagnosis not present

## 2023-09-27 DIAGNOSIS — Z1231 Encounter for screening mammogram for malignant neoplasm of breast: Secondary | ICD-10-CM | POA: Diagnosis not present

## 2023-09-27 LAB — HM MAMMOGRAPHY

## 2023-12-07 ENCOUNTER — Ambulatory Visit: Payer: BC Managed Care – PPO | Admitting: Family Medicine

## 2023-12-07 VITALS — BP 136/82 | HR 68 | Temp 98.2°F | Ht 66.0 in | Wt 224.4 lb

## 2023-12-07 DIAGNOSIS — S161XXA Strain of muscle, fascia and tendon at neck level, initial encounter: Secondary | ICD-10-CM | POA: Diagnosis not present

## 2023-12-07 DIAGNOSIS — M5412 Radiculopathy, cervical region: Secondary | ICD-10-CM

## 2023-12-07 MED ORDER — MELOXICAM 7.5 MG PO TABS: 7.5000 mg | ORAL_TABLET | Freq: Every day | ORAL | 0 refills | Status: AC

## 2023-12-07 MED ORDER — METHYLPREDNISOLONE SODIUM SUCC 125 MG IJ SOLR
125.0000 mg | Freq: Once | INTRAMUSCULAR | Status: AC
  Administered 2023-12-07: 125 mg via INTRAMUSCULAR

## 2023-12-07 MED ORDER — CYCLOBENZAPRINE HCL 10 MG PO TABS: 10.0000 mg | ORAL_TABLET | Freq: Three times a day (TID) | ORAL | 0 refills | Status: DC | PRN
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
## 2023-12-08 ENCOUNTER — Encounter: Payer: Self-pay | Admitting: Family Medicine

## 2023-12-14 ENCOUNTER — Encounter: Payer: Self-pay | Admitting: Family Medicine

## 2023-12-14 ENCOUNTER — Ambulatory Visit (INDEPENDENT_AMBULATORY_CARE_PROVIDER_SITE_OTHER): Payer: BC Managed Care – PPO | Admitting: Family Medicine

## 2023-12-14 ENCOUNTER — Ambulatory Visit (HOSPITAL_BASED_OUTPATIENT_CLINIC_OR_DEPARTMENT_OTHER)
Admission: RE | Admit: 2023-12-14 | Discharge: 2023-12-14 | Disposition: A | Discharge status: Home / Self Care | Payer: BC Managed Care – PPO | Source: Ambulatory Visit | Attending: Family Medicine | Admitting: Family Medicine

## 2023-12-14 VITALS — BP 140/90 | Ht 66.0 in | Wt 224.0 lb

## 2023-12-14 DIAGNOSIS — M4802 Spinal stenosis, cervical region: Secondary | ICD-10-CM | POA: Diagnosis not present

## 2023-12-14 DIAGNOSIS — M549 Dorsalgia, unspecified: Secondary | ICD-10-CM

## 2023-12-14 DIAGNOSIS — M542 Cervicalgia: Secondary | ICD-10-CM | POA: Insufficient documentation

## 2023-12-14 DIAGNOSIS — S46811A Strain of other muscles, fascia and tendons at shoulder and upper arm level, right arm, initial encounter: Secondary | ICD-10-CM | POA: Diagnosis not present

## 2023-12-14 DIAGNOSIS — M47812 Spondylosis without myelopathy or radiculopathy, cervical region: Secondary | ICD-10-CM | POA: Diagnosis not present

## 2023-12-14 DIAGNOSIS — R200 Anesthesia of skin: Secondary | ICD-10-CM | POA: Diagnosis not present

## 2023-12-14 MED ORDER — TRIAMCINOLONE ACETONIDE 40 MG/ML IJ SUSP
20.0000 mg | Freq: Once | INTRAMUSCULAR | Status: AC
  Administered 2023-12-14: 20 mg via INTRAMUSCULAR
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
## 2023-12-28 ENCOUNTER — Other Ambulatory Visit: Payer: Self-pay | Admitting: Family Medicine

## 2023-12-28 DIAGNOSIS — S161XXA Strain of muscle, fascia and tendon at neck level, initial encounter: Secondary | ICD-10-CM

## 2023-12-29 ENCOUNTER — Encounter: Payer: Self-pay | Admitting: Family Medicine

## 2023-12-29 ENCOUNTER — Other Ambulatory Visit: Payer: Self-pay | Admitting: Family Medicine

## 2023-12-29 DIAGNOSIS — M4802 Spinal stenosis, cervical region: Secondary | ICD-10-CM

## 2023-12-29 DIAGNOSIS — S29012A Strain of muscle and tendon of back wall of thorax, initial encounter: Secondary | ICD-10-CM

## 2023-12-29 NOTE — Progress Notes (Signed)
PT referral placed.

## 2024-01-01 ENCOUNTER — Encounter: Payer: Self-pay | Admitting: Family Medicine

## 2024-01-01 ENCOUNTER — Ambulatory Visit (INDEPENDENT_AMBULATORY_CARE_PROVIDER_SITE_OTHER): Payer: BC Managed Care – PPO | Admitting: Family Medicine

## 2024-01-01 VITALS — BP 138/80 | Ht 66.0 in | Wt 224.0 lb

## 2024-01-01 DIAGNOSIS — G8929 Other chronic pain: Secondary | ICD-10-CM | POA: Diagnosis not present

## 2024-01-01 DIAGNOSIS — M542 Cervicalgia: Secondary | ICD-10-CM | POA: Diagnosis not present

## 2024-01-01 MED ORDER — METHYLPREDNISOLONE 4 MG PO TBPK: ORAL_TABLET | ORAL | 0 refills | Status: AC

## 2024-01-01 MED ORDER — METHOCARBAMOL 500 MG PO TABS: 500.0000 mg | ORAL_TABLET | Freq: Four times a day (QID) | ORAL | 0 refills | Status: AC | PRN
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
## 2024-01-03 ENCOUNTER — Other Ambulatory Visit: Payer: Self-pay | Admitting: Family Medicine

## 2024-01-03 DIAGNOSIS — S161XXA Strain of muscle, fascia and tendon at neck level, initial encounter: Secondary | ICD-10-CM
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
## 2024-01-06 ENCOUNTER — Ambulatory Visit
Admission: EM | Admit: 2024-01-06 | Discharge: 2024-01-06 | Disposition: A | Discharge status: Home / Self Care | Payer: BC Managed Care – PPO | Attending: Family Medicine | Admitting: Family Medicine

## 2024-01-06 DIAGNOSIS — R030 Elevated blood-pressure reading, without diagnosis of hypertension: Secondary | ICD-10-CM | POA: Diagnosis not present

## 2024-01-06 DIAGNOSIS — R519 Headache, unspecified: Secondary | ICD-10-CM

## 2024-01-06 DIAGNOSIS — R42 Dizziness and giddiness: Secondary | ICD-10-CM | POA: Diagnosis not present

## 2024-01-06 MED ORDER — CLONIDINE HCL 0.1 MG PO TABS: ORAL_TABLET | ORAL | 0 refills | Status: AC

## 2024-01-06 MED ORDER — CLONIDINE HCL 0.2 MG PO TABS
0.2000 mg | ORAL_TABLET | Freq: Once | ORAL | Status: AC
  Administered 2024-01-06: 0.2 mg via ORAL

## 2024-01-06 NOTE — ED Triage Notes (Addendum)
Patient states "I just don't feel right," I woke up this morning with headache and nausea. I checked my blood pressure and it was 164/116. Patient states her feet are tingling. Sx started this morning. Patient treated with Excedrin and states the headache and nausea is gone. Patient states she started Prednisone 5 days ago for neck pain.

## 2024-01-08 ENCOUNTER — Ambulatory Visit: Payer: BC Managed Care – PPO | Attending: Family Medicine | Admitting: Physical Therapy

## 2024-01-08 ENCOUNTER — Other Ambulatory Visit: Payer: Self-pay

## 2024-01-08 ENCOUNTER — Encounter: Payer: Self-pay | Admitting: Physical Therapy

## 2024-01-08 DIAGNOSIS — S29012A Strain of muscle and tendon of back wall of thorax, initial encounter: Secondary | ICD-10-CM | POA: Insufficient documentation

## 2024-01-08 DIAGNOSIS — M542 Cervicalgia: Secondary | ICD-10-CM | POA: Insufficient documentation

## 2024-01-08 DIAGNOSIS — R252 Cramp and spasm: Secondary | ICD-10-CM | POA: Diagnosis not present

## 2024-01-08 DIAGNOSIS — M4802 Spinal stenosis, cervical region: Secondary | ICD-10-CM | POA: Diagnosis not present

## 2024-01-08 NOTE — Patient Instructions (Signed)

## 2024-01-09 ENCOUNTER — Encounter: Payer: Self-pay | Admitting: Family Medicine

## 2024-01-09 ENCOUNTER — Ambulatory Visit: Payer: BC Managed Care – PPO | Admitting: Family Medicine

## 2024-01-09 VITALS — BP 138/100 | HR 87 | Temp 98.0°F | Ht 66.0 in | Wt 220.4 lb

## 2024-01-09 DIAGNOSIS — Z13220 Encounter for screening for lipoid disorders: Secondary | ICD-10-CM

## 2024-01-09 DIAGNOSIS — E559 Vitamin D deficiency, unspecified: Secondary | ICD-10-CM

## 2024-01-09 DIAGNOSIS — I10 Essential (primary) hypertension: Secondary | ICD-10-CM

## 2024-01-09 DIAGNOSIS — Z131 Encounter for screening for diabetes mellitus: Secondary | ICD-10-CM | POA: Diagnosis not present

## 2024-01-09 DIAGNOSIS — E669 Obesity, unspecified: Secondary | ICD-10-CM

## 2024-01-09 LAB — BASIC METABOLIC PANEL
BUN: 20 mg/dL (ref 6–23)
CO2: 27 meq/L (ref 19–32)
Calcium: 9.2 mg/dL (ref 8.4–10.5)
Chloride: 103 meq/L (ref 96–112)
Creatinine, Ser: 0.81 mg/dL (ref 0.40–1.20)
GFR: 84.53 mL/min (ref >60.00)
Glucose, Bld: 98 mg/dL (ref 70–99)
Potassium: 4.1 meq/L (ref 3.5–5.1)
Sodium: 137 meq/L (ref 135–145)

## 2024-01-09 LAB — VITAMIN D 25 HYDROXY (VIT D DEFICIENCY, FRACTURES): VITD: 26.14 ng/mL — ABNORMAL LOW (ref 30.00–100.00)

## 2024-01-09 LAB — LIPID PANEL
Cholesterol: 197 mg/dL (ref 0–200)
HDL: 70.7 mg/dL (ref >39.00)
LDL Cholesterol: 115 mg/dL — ABNORMAL HIGH (ref 0–99)
NonHDL: 125.84
Total CHOL/HDL Ratio: 3
Triglycerides: 54 mg/dL (ref 0.0–149.0)
VLDL: 10.8 mg/dL (ref 0.0–40.0)

## 2024-01-09 LAB — HEMOGLOBIN A1C: Hgb A1c MFr Bld: 6.2 % (ref 4.6–6.5)

## 2024-01-09 MED ORDER — IRBESARTAN 75 MG PO TABS: 75.0000 mg | ORAL_TABLET | Freq: Every day | ORAL | 2 refills | Status: DC

## 2024-01-09 MED ORDER — VITAMIN D (ERGOCALCIFEROL) 1.25 MG (50000 UNIT) PO CAPS
50000.0000 [IU] | ORAL_CAPSULE | ORAL | 2 refills | Status: AC
Start: 2024-01-09 — End: ?

## 2024-01-09 NOTE — Progress Notes (Signed)
Established Patient Office Visit   Subjective:  Patient ID: Morgan Norris, female    DOB: [birth year]  Age: 53 y.o. MRN: [id number]  No chief complaint on file.   HPI Encounter Diagnoses  Name Primary?   Essential hypertension Yes   Obesity (BMI 35.0-39.9 without comorbidity)    Screening for diabetes mellitus    Vitamin D deficiency    Screening for cholesterol level    Awoke Saturday morning with a headache.  She checked her blood pressure was 160/110.  She waited for a while and then went up to 177/105.  She was seen in urgent care and started on clonidine as needed.  Before this time blood pressure was elevated into the upper 130s to 140s over 80s to 90s for blood donations.  There is been no chest pain shortness of breath blurred vision.  Father has hypertension and diabetes.  Mom has thyroid disease.  She rarely drinks.  There is increased sodium in her diet.  She has a little bit of salt to her water when she drinks it.  No illicit drug use.  She lives with her boyfriend and her 2 sons.  One of her sons has a girlfriend also living there.  They have 3 dogs.  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   Review of Systems  Constitutional: Negative.   HENT: Negative.    Eyes:  Negative for blurred vision, discharge and redness.  Respiratory: Negative.    Cardiovascular: Negative.   Gastrointestinal:  Negative for abdominal pain.  Genitourinary: Negative.   Musculoskeletal: Negative.  Negative for myalgias.  Skin:  Negative for rash.  Neurological:  Positive for headaches. Negative for tingling, loss of consciousness and weakness.  Endo/Heme/Allergies:  Negative for polydipsia.     Current Outpatient Medications:    cloNIDine (CATAPRES) 0.1 MG tablet, Take 1-2 tablets up to twice daily as needed for elevated blood pressure., Disp: 20 tablet, Rfl: 0   irbesartan (AVAPRO) 75 MG tablet, Take 1 tablet (75 mg total) by mouth daily., Disp: 30 tablet, Rfl: 2   levocetirizine (XYZAL) 5 MG  tablet, Take 5 mg by mouth every evening., Disp: , Rfl:    methocarbamol (ROBAXIN) 500 MG tablet, Take 500 mg by mouth at bedtime., Disp: , Rfl:    Multiple Vitamins-Minerals (MULTIVITAMIN GUMMIES ADULT PO), Take by mouth., Disp: , Rfl:    meloxicam (MOBIC) 7.5 MG tablet, Take 1 tablet (7.5 mg total) by mouth daily. (Patient not taking: Reported on 01/09/2024), Disp: 30 tablet, Rfl: 0   methylPREDNISolone (MEDROL DOSEPAK) 4 MG TBPK tablet, Follow instructions on the kit (Patient not taking: Reported on 01/09/2024), Disp: 21 tablet, Rfl: 0   Objective:     BP (!) 138/100 (BP Location: Left Arm, Patient Position: Sitting, Cuff Size: Normal)   Pulse 87   Temp 98 F (36.7 C)   Ht 5\' 6"  (1.676 m)   Wt 220 lb 6.4 oz (100 kg)   SpO2 98%   BMI 35.57 kg/m  BP Readings from Last 3 Encounters:  01/09/24 (!) 138/100  01/06/24 (!) 145/98  01/01/24 138/80   Wt Readings from Last 3 Encounters:  01/09/24 220 lb 6.4 oz (100 kg)  01/06/24 220 lb (99.8 kg)  01/01/24 224 lb (101.6 kg)      Physical Exam Constitutional:      General: She is not in acute distress.    Appearance: Normal appearance. She is not ill-appearing, toxic-appearing or diaphoretic.  HENT:     Head:  Normocephalic and atraumatic.     Right Ear: Tympanic membrane, ear canal and external ear normal.     Left Ear: Tympanic membrane, ear canal and external ear normal.     Mouth/Throat:     Mouth: Mucous membranes are moist.     Pharynx: Oropharynx is clear. No oropharyngeal exudate or posterior oropharyngeal erythema.  Eyes:     General: No scleral icterus.       Right eye: No discharge.        Left eye: No discharge.     Extraocular Movements: Extraocular movements intact.     Conjunctiva/sclera: Conjunctivae normal.     Pupils: Pupils are equal, round, and reactive to light.  Cardiovascular:     Rate and Rhythm: Normal rate and regular rhythm.  Pulmonary:     Effort: Pulmonary effort is normal. No respiratory distress.      Breath sounds: Normal breath sounds.  Abdominal:     General: Bowel sounds are normal.  Musculoskeletal:     Cervical back: No rigidity or tenderness.     Right lower leg: No edema.     Left lower leg: No edema.  Skin:    General: Skin is warm and dry.  Neurological:     Mental Status: She is alert and oriented to person, place, and time.  Psychiatric:        Mood and Affect: Mood normal.        Behavior: Behavior normal.      No results found for any visits on 01/09/24.    The ASCVD Risk score (Arnett DK, et al., 2019) failed to calculate for the following reasons:   Cannot find a previous HDL lab   Cannot find a previous total cholesterol lab    Assessment & Plan:   Essential hypertension -     EKG 12-Lead -     Basic metabolic panel -     Irbesartan; Take 1 tablet (75 mg total) by mouth daily.  Dispense: 30 tablet; Refill: 2  Obesity (BMI 35.0-39.9 without comorbidity)  Screening for diabetes mellitus -     Basic metabolic panel -     Hemoglobin A1c  Vitamin D deficiency -     VITAMIN D 25 Hydroxy (Vit-D Deficiency, Fractures)  Screening for cholesterol level -     Lipid panel    Return in about 1 month (around 02/09/2024).  Will start Avapro 75 mg today.  Information was given on managing hypertension and have blood pressure.  She will continue exercising by walking.  Encouraged her to avoid sodium.  Advised that weight loss of even a modest amount could help her blood pressure.  History of vitamin D deficiency.  Family history of diabetes.  EKG showed sinus rhythm with a rate of 70.    Mliss Sax, MD

## 2024-01-09 NOTE — Addendum Note (Signed)
Addended by: Andrez Grime on: 01/09/2024 04:53 PM   Modules accepted: Orders

## 2024-01-10 NOTE — ED Provider Notes (Signed)
[facility]   [id number] 01/06/24 Arrival Time: 1157  ASSESSMENT & PLAN:  1. Elevated blood pressure reading without diagnosis of hypertension   2. Lightheadedness   3. Acute nonintractable headache, unspecified headache type     Meds ordered this encounter  Medications   cloNIDine (CATAPRES) tablet 0.2 mg   cloNIDine (CATAPRES) 0.1 MG tablet    Sig: Take 1-2 tablets up to twice daily as needed for elevated blood pressure.    Dispense:  20 tablet    Refill:  0  BP has improved. Denies HA now. Feeling better.  Has f/u with PCP next week. Rx clonidine to have if pressure increases at home with same but not worsening symptoms. She is agreeable to ED visit if she feels any worse.    Follow-up Information     Mliss Sax, MD.   Specialty: Family Medicine Why: Call Monday morning to get an appointment as soon as possible. Contact information: [address] [place] [address] [phone]                 Reviewed expectations re: course of current medical issues. Questions answered. Outlined signs and symptoms indicating need for more acute intervention. Patient verbalized understanding. After Visit Summary given.   SUBJECTIVE:  Morgan Norris is a 53 y.o. female who presents with concerns regarding increased blood pressures. @HECAP @ reports that she has not been treated for hypertension in the past.  Patient states "I just don't feel right," I woke up this morning with headache and nausea. I checked my blood pressure and it was 164/116. Patient states her feet are tingling. Sx started this morning. Patient treated with Excedrin and states the headache and nausea is gone. Patient states she started Prednisone 5 days ago for neck pain.  Denies CP/SOB.  Social History   Tobacco Use  Smoking Status Never  Smokeless Tobacco Never   Denies recreational drug use.  OBJECTIVE:  Vitals:   01/06/24 1240 01/06/24 1241 01/06/24 1446   BP:  (!) 177/105 (!) 145/98  Pulse:  75 66  Resp:  18 16  Temp:  98.3 F (36.8 C)   TempSrc:  Oral   SpO2:  100% 99%  Weight: 99.8 kg    Height: 5\' 6"  (1.676 m)      General appearance: alert; no distress Eyes: PERRLA; EOMI HENT: normocephalic; atraumatic Neck: supple Lungs: clear to auscultation bilaterally Heart: regular rate and rhythm without murmer Abdomen: soft, non-tender; bowel sounds normal Extremities: no edema; symmetrical with no gross deformities Skin: warm and dry Neuro: CN 2-12 grossly intact Psychological: alert and cooperative; normal mood and affect   Allergies  Allergen Reactions   Penicillins Other (See Comments) and Nausea And Vomiting    unknown    Past Medical History:  Diagnosis Date   Allergy    Depression 2015   Post-operative nausea and vomiting    Seasonal allergies    Social History   Socioeconomic History   Marital status: Divorced    Spouse name: Not on file   Number of children: Not on file   Years of education: Not on file   Highest education level: Associate degree: academic program  Occupational History   Not on file  Tobacco Use   Smoking status: Never   Smokeless tobacco: Never  Vaping Use   Vaping status: Never Used  Substance and Sexual Activity   Alcohol use: Yes    Comment: once a month per pt   Drug  use: No   Sexual activity: Not on file  Other Topics Concern   Not on file  Social History Narrative   Not on file   Social Drivers of Health   Financial Resource Strain: Low Risk  (12/07/2023)   Overall Financial Resource Strain (CARDIA)    Difficulty of Paying Living Expenses: Not hard at all  Food Insecurity: No Food Insecurity (12/07/2023)   Hunger Vital Sign    Worried About Running Out of Food in the Last Year: Never true    Ran Out of Food in the Last Year: Never true  Transportation Needs: No Transportation Needs (12/07/2023)   PRAPARE - Administrator, Civil Service (Medical): No     Lack of Transportation (Non-Medical): No  Physical Activity: Unknown (12/07/2023)   Exercise Vital Sign    Days of Exercise per Week: 0 days    Minutes of Exercise per Session: Not on file  Stress: Stress Concern Present (12/07/2023)   [employer] of Occupational Health - Occupational Stress Questionnaire    Feeling of Stress : To some extent  Social Connections: Moderately Isolated (12/07/2023)   Social Connection and Isolation Panel [NHANES]    Frequency of Communication with Friends and Family: More than three times a week    Frequency of Social Gatherings with Friends and Family: Once a week    Attends Religious Services: Never    Database administrator or Organizations: No    Attends Engineer, structural: Not on file    Marital Status: Living with partner  Intimate Partner Violence: Not on file   Family History  Problem Relation Age of Onset   Colon polyps Mother    Hypothyroidism Mother    Hypertension Father    Hyperlipidemia Father    Colon cancer Neg Hx    Esophageal cancer Neg Hx    Stomach cancer Neg Hx    Rectal cancer Neg Hx    Past Surgical History:  Procedure Laterality Date   APPENDECTOMY     CESAREAN SECTION     chin inplant     EYE SURGERY     RHINOPLASTY     WISDOM TOOTH EXTRACTION         Mardella Layman, MD 01/10/24 1014

## 2024-01-11 ENCOUNTER — Ambulatory Visit: Payer: BC Managed Care – PPO | Admitting: Physical Therapy

## 2024-01-11 ENCOUNTER — Encounter: Payer: Self-pay | Admitting: Physical Therapy

## 2024-01-11 DIAGNOSIS — M542 Cervicalgia: Secondary | ICD-10-CM

## 2024-01-11 DIAGNOSIS — R252 Cramp and spasm: Secondary | ICD-10-CM | POA: Diagnosis not present

## 2024-01-11 DIAGNOSIS — S29012A Strain of muscle and tendon of back wall of thorax, initial encounter: Secondary | ICD-10-CM | POA: Diagnosis not present

## 2024-01-11 DIAGNOSIS — M4802 Spinal stenosis, cervical region: Secondary | ICD-10-CM | POA: Diagnosis not present
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
## 2024-01-11 NOTE — Patient Instructions (Signed)

## 2024-01-15 ENCOUNTER — Ambulatory Visit: Payer: BC Managed Care – PPO | Attending: Family Medicine

## 2024-01-15 DIAGNOSIS — M542 Cervicalgia: Secondary | ICD-10-CM | POA: Insufficient documentation

## 2024-01-15 DIAGNOSIS — R252 Cramp and spasm: Secondary | ICD-10-CM | POA: Insufficient documentation
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
## 2024-01-17 ENCOUNTER — Ambulatory Visit: Payer: BC Managed Care – PPO | Admitting: Physical Therapy

## 2024-01-17 ENCOUNTER — Encounter: Payer: Self-pay | Admitting: Physical Therapy

## 2024-01-17 DIAGNOSIS — R252 Cramp and spasm: Secondary | ICD-10-CM

## 2024-01-17 DIAGNOSIS — M542 Cervicalgia: Secondary | ICD-10-CM | POA: Diagnosis not present
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
## 2024-01-26 ENCOUNTER — Ambulatory Visit: Payer: BC Managed Care – PPO | Admitting: Physical Therapy

## 2024-01-29 ENCOUNTER — Ambulatory Visit: Payer: BC Managed Care – PPO

## 2024-01-29 DIAGNOSIS — M542 Cervicalgia: Secondary | ICD-10-CM | POA: Diagnosis not present

## 2024-01-29 DIAGNOSIS — R252 Cramp and spasm: Secondary | ICD-10-CM | POA: Diagnosis not present
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
## 2024-02-01 ENCOUNTER — Ambulatory Visit: Payer: BC Managed Care – PPO

## 2024-02-01 DIAGNOSIS — R252 Cramp and spasm: Secondary | ICD-10-CM

## 2024-02-01 DIAGNOSIS — M542 Cervicalgia: Secondary | ICD-10-CM | POA: Diagnosis not present
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
## 2024-02-05 ENCOUNTER — Encounter: Payer: BC Managed Care – PPO | Admitting: Physical Therapy

## 2024-02-12 ENCOUNTER — Encounter: Payer: Self-pay | Admitting: Family Medicine

## 2024-02-12 ENCOUNTER — Ambulatory Visit: Payer: BC Managed Care – PPO | Admitting: Family Medicine

## 2024-02-12 VITALS — BP 138/88 | HR 79 | Temp 98.0°F | Ht 66.0 in | Wt 232.0 lb

## 2024-02-12 DIAGNOSIS — E559 Vitamin D deficiency, unspecified: Secondary | ICD-10-CM

## 2024-02-12 DIAGNOSIS — E669 Obesity, unspecified: Secondary | ICD-10-CM | POA: Diagnosis not present

## 2024-02-12 DIAGNOSIS — I10 Essential (primary) hypertension: Secondary | ICD-10-CM | POA: Diagnosis not present

## 2024-02-12 DIAGNOSIS — R7303 Prediabetes: Secondary | ICD-10-CM | POA: Diagnosis not present

## 2024-02-12 LAB — BASIC METABOLIC PANEL
BUN: 11 mg/dL (ref 6–23)
CO2: 26 meq/L (ref 19–32)
Calcium: 8.8 mg/dL (ref 8.4–10.5)
Chloride: 105 meq/L (ref 96–112)
Creatinine, Ser: 0.74 mg/dL (ref 0.40–1.20)
GFR: 94.15 mL/min (ref >60.00)
Glucose, Bld: 116 mg/dL — ABNORMAL HIGH (ref 70–99)
Potassium: 4.3 meq/L (ref 3.5–5.1)
Sodium: 139 meq/L (ref 135–145)

## 2024-02-12 MED ORDER — IRBESARTAN 150 MG PO TABS
150.0000 mg | ORAL_TABLET | Freq: Every day | ORAL | 1 refills | Status: DC
Start: 2024-02-12 — End: 2024-08-06

## 2024-02-12 NOTE — Progress Notes (Signed)
 Established Patient Office Visit   Subjective:  Patient ID: Morgan Norris, female    DOB: [birth year]  Age: 53 y.o. MRN: [id number]  Chief Complaint  Patient presents with   Hypertension    1 month follow up HTN. Pt states home reading have improved. Tolerating Avapro well. No concerns in office today.     Hypertension Pertinent negatives include no blurred vision.   Encounter Diagnoses  Name Primary?   Essential hypertension Yes   Prediabetes    Vitamin D deficiency    Obesity (BMI 35.0-39.9 without comorbidity)    For follow-up of above.  Blood pressure at home has been running in the 120s to 130s over 80s.  She was just at a convention in [facility] and gained some weight.  She has been exercising walking.   Review of Systems  Constitutional: Negative.   HENT: Negative.    Eyes:  Negative for blurred vision, discharge and redness.  Respiratory: Negative.    Cardiovascular: Negative.   Gastrointestinal:  Negative for abdominal pain.  Genitourinary: Negative.   Musculoskeletal: Negative.  Negative for myalgias.  Skin:  Negative for rash.  Neurological:  Negative for tingling, loss of consciousness and weakness.  Endo/Heme/Allergies:  Negative for polydipsia.     Current Outpatient Medications:    cloNIDine (CATAPRES) 0.1 MG tablet, Take 1-2 tablets up to twice daily as needed for elevated blood pressure., Disp: 20 tablet, Rfl: 0   irbesartan (AVAPRO) 150 MG tablet, Take 1 tablet (150 mg total) by mouth daily., Disp: 90 tablet, Rfl: 1   levocetirizine (XYZAL) 5 MG tablet, Take 5 mg by mouth every evening., Disp: , Rfl:    Multiple Vitamins-Minerals (MULTIVITAMIN GUMMIES ADULT PO), Take by mouth., Disp: , Rfl:    Vitamin D, Ergocalciferol, (DRISDOL) 1.25 MG (50000 UNIT) CAPS capsule, Take 1 capsule (50,000 Units total) by mouth every 7 (seven) days., Disp: 12 capsule, Rfl: 2   meloxicam (MOBIC) 7.5 MG tablet, Take 1 tablet (7.5 mg total) by mouth daily. (Patient not taking:  Reported on 02/12/2024), Disp: 30 tablet, Rfl: 0   methocarbamol (ROBAXIN) 500 MG tablet, Take 500 mg by mouth at bedtime. (Patient not taking: Reported on 02/12/2024), Disp: , Rfl:    methylPREDNISolone (MEDROL DOSEPAK) 4 MG TBPK tablet, Follow instructions on the kit (Patient not taking: Reported on 02/12/2024), Disp: 21 tablet, Rfl: 0   Objective:     BP 138/88   Pulse 79   Temp 98 F (36.7 C)   Ht 5\' 6"  (1.676 m)   Wt 232 lb (105.2 kg)   SpO2 95%   BMI 37.45 kg/m  BP Readings from Last 3 Encounters:  02/12/24 138/88  01/09/24 (!) 138/100  01/06/24 (!) 145/98   Wt Readings from Last 3 Encounters:  02/12/24 232 lb (105.2 kg)  01/09/24 220 lb 6.4 oz (100 kg)  01/06/24 220 lb (99.8 kg)      Physical Exam Constitutional:      General: She is not in acute distress.    Appearance: Normal appearance. She is not ill-appearing, toxic-appearing or diaphoretic.  HENT:     Head: Normocephalic and atraumatic.     Right Ear: External ear normal.     Left Ear: External ear normal.  Eyes:     General: No scleral icterus.       Right eye: No discharge.        Left eye: No discharge.     Extraocular Movements: Extraocular movements intact.  Conjunctiva/sclera: Conjunctivae normal.  Pulmonary:     Effort: Pulmonary effort is normal. No respiratory distress.  Skin:    General: Skin is warm and dry.  Neurological:     Mental Status: She is alert and oriented to person, place, and time.  Psychiatric:        Mood and Affect: Mood normal.        Behavior: Behavior normal.      No results found for any visits on 02/12/24.    The 10-year ASCVD risk score (Arnett DK, et al., 2019) is: 1.4%    Assessment & Plan:   Essential hypertension -     Irbesartan; Take 1 tablet (150 mg total) by mouth daily.  Dispense: 90 tablet; Refill: 1 -     Basic metabolic panel  Prediabetes -     Basic metabolic panel  Vitamin D deficiency  Obesity (BMI 35.0-39.9 without comorbidity) -      Amb Ref to Medical Weight Management    Return in about 8 weeks (around 04/08/2024), or Bring your BP cuff for next visit.  Continue to check and record blood pressures periodically.  Information was given on prediabetes and exercising to lose weight.   Mliss Sax, MD

## 2024-02-13 ENCOUNTER — Encounter: Payer: Self-pay | Admitting: Physical Therapy

## 2024-02-13 ENCOUNTER — Encounter (INDEPENDENT_AMBULATORY_CARE_PROVIDER_SITE_OTHER): Payer: Self-pay

## 2024-02-16 ENCOUNTER — Ambulatory Visit: Payer: Self-pay | Admitting: Physical Therapy

## 2024-02-19 ENCOUNTER — Ambulatory Visit: Payer: Self-pay | Admitting: Physical Therapy

## 2024-07-30 DIAGNOSIS — R232 Flushing: Secondary | ICD-10-CM | POA: Diagnosis not present

## 2024-07-30 DIAGNOSIS — N951 Menopausal and female climacteric states: Secondary | ICD-10-CM | POA: Diagnosis not present

## 2024-07-30 DIAGNOSIS — N898 Other specified noninflammatory disorders of vagina: Secondary | ICD-10-CM | POA: Diagnosis not present

## 2024-07-30 DIAGNOSIS — R635 Abnormal weight gain: Secondary | ICD-10-CM | POA: Diagnosis not present

## 2024-07-30 DIAGNOSIS — Z1339 Encounter for screening examination for other mental health and behavioral disorders: Secondary | ICD-10-CM | POA: Diagnosis not present

## 2024-07-30 DIAGNOSIS — Z1331 Encounter for screening for depression: Secondary | ICD-10-CM | POA: Diagnosis not present

## 2024-07-30 DIAGNOSIS — Z01411 Encounter for gynecological examination (general) (routine) with abnormal findings: Secondary | ICD-10-CM | POA: Diagnosis not present

## 2024-08-02 DIAGNOSIS — Z01419 Encounter for gynecological examination (general) (routine) without abnormal findings: Secondary | ICD-10-CM | POA: Diagnosis not present

## 2024-08-02 DIAGNOSIS — N951 Menopausal and female climacteric states: Secondary | ICD-10-CM | POA: Diagnosis not present

## 2024-08-02 DIAGNOSIS — N898 Other specified noninflammatory disorders of vagina: Secondary | ICD-10-CM | POA: Diagnosis not present

## 2024-08-04 ENCOUNTER — Other Ambulatory Visit: Payer: Self-pay | Admitting: Family Medicine

## 2024-08-04 DIAGNOSIS — I10 Essential (primary) hypertension: Secondary | ICD-10-CM

## 2024-08-27 DIAGNOSIS — R635 Abnormal weight gain: Secondary | ICD-10-CM | POA: Diagnosis not present

## 2024-08-27 DIAGNOSIS — N951 Menopausal and female climacteric states: Secondary | ICD-10-CM | POA: Diagnosis not present

## 2024-08-27 DIAGNOSIS — R232 Flushing: Secondary | ICD-10-CM | POA: Diagnosis not present

## 2024-09-01 ENCOUNTER — Other Ambulatory Visit: Payer: Self-pay | Admitting: Family Medicine

## 2024-09-01 DIAGNOSIS — I10 Essential (primary) hypertension: Secondary | ICD-10-CM

## 2024-09-04 ENCOUNTER — Other Ambulatory Visit: Payer: Self-pay | Admitting: Family Medicine

## 2024-09-04 DIAGNOSIS — I10 Essential (primary) hypertension: Secondary | ICD-10-CM

## 2024-09-04 NOTE — Telephone Encounter (Signed)
 Called patient and informed her of my call. Patient stated that she has moved away from the [place] area and she is not sure why we are receiving a refill request she did not call her pharmacy to request a refill. I asked patient to confirm that she is not longer under the care of Dr. Berneta and she verbalized that she has a new PCP and that Dr. Berneta is no longer her doctor. I thanked her for taking my call that I will refuse the medication request and pass this information to the office manger to have Dr. Berneta removed as her PCP. She thanked me for calling.

## 2024-09-10 DIAGNOSIS — F419 Anxiety disorder, unspecified: Secondary | ICD-10-CM | POA: Diagnosis not present

## 2024-09-10 DIAGNOSIS — F32A Depression, unspecified: Secondary | ICD-10-CM | POA: Diagnosis not present

## 2024-09-10 DIAGNOSIS — R7301 Impaired fasting glucose: Secondary | ICD-10-CM | POA: Diagnosis not present

## 2024-09-10 DIAGNOSIS — I10 Essential (primary) hypertension: Secondary | ICD-10-CM | POA: Diagnosis not present

## 2024-09-10 DIAGNOSIS — R232 Flushing: Secondary | ICD-10-CM | POA: Diagnosis not present

## 2024-09-25 DIAGNOSIS — Z0000 Encounter for general adult medical examination without abnormal findings: Secondary | ICD-10-CM | POA: Diagnosis not present

## 2024-09-25 DIAGNOSIS — R7301 Impaired fasting glucose: Secondary | ICD-10-CM | POA: Diagnosis not present

## 2024-09-25 DIAGNOSIS — F419 Anxiety disorder, unspecified: Secondary | ICD-10-CM | POA: Diagnosis not present

## 2024-09-25 DIAGNOSIS — F32A Depression, unspecified: Secondary | ICD-10-CM | POA: Diagnosis not present

## 2024-09-30 DIAGNOSIS — Z1231 Encounter for screening mammogram for malignant neoplasm of breast: Secondary | ICD-10-CM | POA: Diagnosis not present
